# Patient Record
Sex: Male | Born: 1940 | Race: White | Hispanic: No | Marital: Married | Smoking: Former smoker
Health system: Southern US, Community
[De-identification: ages and names within clinical notes are randomized; demographics above are authoritative.]

## PROBLEM LIST (undated history)

## (undated) DIAGNOSIS — F419 Anxiety disorder, unspecified: Secondary | ICD-10-CM

## (undated) DIAGNOSIS — I2510 Atherosclerotic heart disease of native coronary artery without angina pectoris: Secondary | ICD-10-CM

## (undated) DIAGNOSIS — I5022 Chronic systolic (congestive) heart failure: Secondary | ICD-10-CM

## (undated) DIAGNOSIS — E785 Hyperlipidemia, unspecified: Secondary | ICD-10-CM

## (undated) DIAGNOSIS — M1990 Unspecified osteoarthritis, unspecified site: Secondary | ICD-10-CM

## (undated) DIAGNOSIS — I10 Essential (primary) hypertension: Secondary | ICD-10-CM

## (undated) DIAGNOSIS — S36503A Unspecified injury of sigmoid colon, initial encounter: Secondary | ICD-10-CM

## (undated) DIAGNOSIS — I493 Ventricular premature depolarization: Secondary | ICD-10-CM

## (undated) DIAGNOSIS — I214 Non-ST elevation (NSTEMI) myocardial infarction: Secondary | ICD-10-CM

## (undated) DIAGNOSIS — F32A Depression, unspecified: Secondary | ICD-10-CM

## (undated) DIAGNOSIS — I701 Atherosclerosis of renal artery: Secondary | ICD-10-CM

## (undated) DIAGNOSIS — I255 Ischemic cardiomyopathy: Secondary | ICD-10-CM

## (undated) DIAGNOSIS — I6529 Occlusion and stenosis of unspecified carotid artery: Secondary | ICD-10-CM

## (undated) DIAGNOSIS — I639 Cerebral infarction, unspecified: Secondary | ICD-10-CM

## (undated) DIAGNOSIS — C61 Malignant neoplasm of prostate: Secondary | ICD-10-CM

## (undated) DIAGNOSIS — F329 Major depressive disorder, single episode, unspecified: Secondary | ICD-10-CM

## (undated) HISTORY — DX: Atherosclerotic heart disease of native coronary artery without angina pectoris: I25.10

## (undated) HISTORY — DX: Essential (primary) hypertension: I10

## (undated) HISTORY — DX: Cerebral infarction, unspecified: I63.9

## (undated) HISTORY — DX: Unspecified osteoarthritis, unspecified site: M19.90

## (undated) HISTORY — DX: Depression, unspecified: F32.A

## (undated) HISTORY — DX: Ischemic cardiomyopathy: I25.5

## (undated) HISTORY — DX: Malignant neoplasm of prostate: C61

## (undated) HISTORY — PX: CORONARY ARTERY BYPASS GRAFT: SHX141

## (undated) HISTORY — DX: Non-ST elevation (NSTEMI) myocardial infarction: I21.4

## (undated) HISTORY — DX: Unspecified injury of sigmoid colon, initial encounter: S36.503A

## (undated) HISTORY — DX: Chronic systolic (congestive) heart failure: I50.22

## (undated) HISTORY — DX: Ventricular premature depolarization: I49.3

## (undated) HISTORY — PX: COLON SURGERY: SHX602

## (undated) HISTORY — DX: Atherosclerosis of renal artery: I70.1

## (undated) HISTORY — DX: Occlusion and stenosis of unspecified carotid artery: I65.29

## (undated) HISTORY — PX: PROSTATE SURGERY: SHX751

## (undated) HISTORY — DX: Major depressive disorder, single episode, unspecified: F32.9

## (undated) HISTORY — DX: Hyperlipidemia, unspecified: E78.5

## (undated) HISTORY — DX: Anxiety disorder, unspecified: F41.9

---

## 2007-04-06 ENCOUNTER — Ambulatory Visit: Payer: Self-pay | Admitting: Cardiology

## 2007-04-17 ENCOUNTER — Ambulatory Visit: Payer: Self-pay | Admitting: Cardiology

## 2007-07-03 ENCOUNTER — Ambulatory Visit: Payer: Self-pay | Admitting: Cardiology

## 2007-09-17 ENCOUNTER — Ambulatory Visit: Payer: Self-pay | Admitting: Cardiology

## 2007-09-25 ENCOUNTER — Ambulatory Visit: Payer: Self-pay

## 2007-09-26 ENCOUNTER — Ambulatory Visit: Payer: Self-pay | Admitting: Cardiology

## 2007-09-26 LAB — CONVERTED CEMR LAB
Basophils Absolute: 0 10*3/uL (ref 0.0–0.1)
Basophils Relative: 0.4 % (ref 0.0–1.0)
CO2: 30 meq/L (ref 19–32)
Chloride: 107 meq/L (ref 96–112)
Creatinine, Ser: 1.5 mg/dL (ref 0.4–1.5)
Lymphocytes Relative: 25 % (ref 12.0–46.0)
MCHC: 33 g/dL (ref 30.0–36.0)
Neutrophils Relative %: 67.3 % (ref 43.0–77.0)
Platelets: 222 10*3/uL (ref 150–400)
Potassium: 4.3 meq/L (ref 3.5–5.1)
Prothrombin Time: 13 s (ref 10.9–13.3)
RBC: 5.22 M/uL (ref 4.22–5.81)
WBC: 7.8 10*3/uL (ref 4.5–10.5)
aPTT: 28.6 s (ref 21.7–29.8)

## 2007-09-27 ENCOUNTER — Inpatient Hospital Stay (HOSPITAL_BASED_OUTPATIENT_CLINIC_OR_DEPARTMENT_OTHER): Admission: RE | Admit: 2007-09-27 | Discharge: 2007-09-27 | Payer: Self-pay | Admitting: Cardiology

## 2007-09-27 ENCOUNTER — Ambulatory Visit: Payer: Self-pay | Admitting: Cardiology

## 2007-10-09 ENCOUNTER — Ambulatory Visit: Payer: Self-pay | Admitting: Cardiology

## 2007-10-09 LAB — CONVERTED CEMR LAB
CO2: 27 meq/L (ref 19–32)
Chloride: 107 meq/L (ref 96–112)
Creatinine, Ser: 1.4 mg/dL (ref 0.4–1.5)
GFR calc non Af Amer: 54 mL/min
Potassium: 3.6 meq/L (ref 3.5–5.1)
Sodium: 143 meq/L (ref 135–145)

## 2007-12-31 ENCOUNTER — Ambulatory Visit: Payer: Self-pay | Admitting: Cardiology

## 2008-11-01 DIAGNOSIS — E785 Hyperlipidemia, unspecified: Secondary | ICD-10-CM

## 2008-11-01 DIAGNOSIS — I10 Essential (primary) hypertension: Secondary | ICD-10-CM | POA: Insufficient documentation

## 2008-11-14 ENCOUNTER — Encounter: Payer: Self-pay | Admitting: Cardiology

## 2009-01-05 ENCOUNTER — Encounter: Payer: Self-pay | Admitting: Cardiology

## 2009-01-21 ENCOUNTER — Ambulatory Visit: Payer: Self-pay | Admitting: Cardiology

## 2009-01-21 DIAGNOSIS — I6529 Occlusion and stenosis of unspecified carotid artery: Secondary | ICD-10-CM

## 2009-01-21 DIAGNOSIS — I701 Atherosclerosis of renal artery: Secondary | ICD-10-CM | POA: Insufficient documentation

## 2009-02-05 ENCOUNTER — Encounter: Payer: Self-pay | Admitting: Cardiology

## 2009-02-05 ENCOUNTER — Ambulatory Visit: Payer: Self-pay

## 2009-02-06 ENCOUNTER — Telehealth: Payer: Self-pay | Admitting: Cardiology

## 2009-02-11 ENCOUNTER — Ambulatory Visit: Payer: Self-pay | Admitting: Cardiology

## 2009-04-16 ENCOUNTER — Encounter: Payer: Self-pay | Admitting: Cardiology

## 2009-07-21 ENCOUNTER — Encounter: Payer: Self-pay | Admitting: Cardiology

## 2009-08-24 ENCOUNTER — Telehealth: Payer: Self-pay | Admitting: Cardiology

## 2009-08-25 ENCOUNTER — Encounter: Payer: Self-pay | Admitting: Cardiology

## 2009-10-08 DIAGNOSIS — I2510 Atherosclerotic heart disease of native coronary artery without angina pectoris: Secondary | ICD-10-CM | POA: Insufficient documentation

## 2009-10-19 ENCOUNTER — Ambulatory Visit: Payer: Self-pay | Admitting: Cardiology

## 2009-10-19 DIAGNOSIS — R002 Palpitations: Secondary | ICD-10-CM | POA: Insufficient documentation

## 2009-10-20 ENCOUNTER — Encounter: Payer: Self-pay | Admitting: Cardiology

## 2010-02-09 ENCOUNTER — Ambulatory Visit: Payer: Self-pay

## 2010-02-09 ENCOUNTER — Encounter: Payer: Self-pay | Admitting: Cardiology

## 2010-05-06 ENCOUNTER — Ambulatory Visit: Payer: Self-pay | Admitting: Cardiology

## 2010-05-06 ENCOUNTER — Encounter: Payer: Self-pay | Admitting: Cardiology

## 2010-05-08 ENCOUNTER — Ambulatory Visit: Payer: Self-pay | Admitting: Cardiovascular Disease

## 2010-05-08 ENCOUNTER — Ambulatory Visit: Payer: Self-pay | Admitting: Pulmonary Disease

## 2010-05-08 ENCOUNTER — Inpatient Hospital Stay (HOSPITAL_COMMUNITY)
Admission: EM | Admit: 2010-05-08 | Discharge: 2010-05-28 | Payer: Self-pay | Source: Home / Self Care | Attending: Thoracic Surgery (Cardiothoracic Vascular Surgery) | Admitting: Thoracic Surgery (Cardiothoracic Vascular Surgery)

## 2010-05-11 ENCOUNTER — Ambulatory Visit: Payer: Self-pay | Admitting: Thoracic Surgery (Cardiothoracic Vascular Surgery)

## 2010-05-11 ENCOUNTER — Encounter: Payer: Self-pay | Admitting: Thoracic Surgery (Cardiothoracic Vascular Surgery)

## 2010-05-11 ENCOUNTER — Encounter: Payer: Self-pay | Admitting: Cardiology

## 2010-05-17 ENCOUNTER — Encounter: Payer: Self-pay | Admitting: Thoracic Surgery (Cardiothoracic Vascular Surgery)

## 2010-06-22 ENCOUNTER — Encounter
Admission: RE | Admit: 2010-06-22 | Discharge: 2010-06-22 | Payer: Self-pay | Source: Home / Self Care | Attending: Thoracic Surgery (Cardiothoracic Vascular Surgery) | Admitting: Thoracic Surgery (Cardiothoracic Vascular Surgery)

## 2010-06-22 ENCOUNTER — Ambulatory Visit
Admission: RE | Admit: 2010-06-22 | Discharge: 2010-06-22 | Payer: Self-pay | Source: Home / Self Care | Attending: Thoracic Surgery (Cardiothoracic Vascular Surgery) | Admitting: Thoracic Surgery (Cardiothoracic Vascular Surgery)

## 2010-06-22 ENCOUNTER — Encounter: Payer: Self-pay | Admitting: Cardiology

## 2010-06-28 ENCOUNTER — Telehealth: Payer: Self-pay | Admitting: Cardiology

## 2010-06-29 ENCOUNTER — Encounter: Payer: Self-pay | Admitting: Cardiology

## 2010-06-29 ENCOUNTER — Ambulatory Visit
Admission: RE | Admit: 2010-06-29 | Discharge: 2010-06-29 | Payer: Self-pay | Source: Home / Self Care | Attending: Cardiology | Admitting: Cardiology

## 2010-06-29 DIAGNOSIS — I4891 Unspecified atrial fibrillation: Secondary | ICD-10-CM | POA: Insufficient documentation

## 2010-07-20 NOTE — Assessment & Plan Note (Signed)
Summary: PER CHECK OT/SF  Medications Added METOPROLOL TARTRATE 25 MG TABS (METOPROLOL TARTRATE) 1/2 TAB two times a day CLONIDINE HCL 0.1 MG TABS (CLONIDINE HCL) 1 tab two times a day TEKTURNA 150 MG TABS (ALISKIREN FUMARATE) 1 tab once daily MUCINEX 600 MG XR12H-TAB (GUAIFENESIN) 2 tab once daily        Visit Type:  6 mo f/u Primary Provider:  Merrily Pew  CC:  palpitations...chest tightness..pt has lost 20 lb since last year due to new dx of diabetes in Nov 2010.  History of Present Illness: Mr. Aaron [name] returns today for evaluation management of his coronary disease, occluded stent to the circumflex marginal with good collaterals, difficult to control hypertension, history of nonobstructive carotid disease, history of renal artery stenting.  He had a lot of palpitations lately. He says they're driving an absolute crazy. There is no rhyme or reason with him. They're clearly not associated with exertion. They're not worse at night.  We tried to increase his beta blocker but he had extreme fatigue as he puts it he fights depression. He clearly has reduction in his palpitations when he does take his beta blocker in the morning. He's currently on 25 mg metoprolol succinate a day.  He's having some exertional angina but this is stable. He refuses to take nitroglycerin because of previous intolerance.  His carotid Dopplers and renal ultrasound were stable in August of 2010.  I reviewed blood work from Dr. Tomasa Blase which demonstrates a total cholesterol 163, HDL 41, LDL of 95, and normal triglycerides. His blood sugar was 120 with a hemoglobin A1c of 7.4%.  Current Medications (verified): 1)  Amiloride Hcl 5 Mg Tabs (Amiloride Hcl) .Marland Kitchen.. 1 Tab Once Daily 2)  Metoprolol Succinate 25 Mg Xr24h-Tab (Metoprolol Succinate) .Marland Kitchen.. 1 Tab Once Daily 3)  Exforge 10-320 Mg Tabs (Amlodipine Besylate-Valsartan) .Marland Kitchen.. 1 Tab Once Daily 4)  Plavix 75 Mg Tabs (Clopidogrel Bisulfate) .Marland Kitchen.. 1 Tab Once Daily 5)   Aspirin 81 Mg Tbec (Aspirin) .... Take One Tablet By Mouth Daily 6)  Tricor 145 Mg Tabs (Fenofibrate) .Marland Kitchen.. 1 Tab Once Daily 7)  Clonidine Hcl 0.1 Mg Tabs (Clonidine Hcl) .Marland Kitchen.. 1 Tab Two Times A Day 8)  Mucinex 600 Mg Xr12h-Tab (Guaifenesin) .... 2 Tabs Once Daily 9)  Simvastatin 80 Mg Tabs (Simvastatin) .Marland Kitchen.. 1 Tab At Bedtime 10)  Tekturna 150 Mg Tabs (Aliskiren Fumarate) .Marland Kitchen.. 1 Tab Once Daily 11)  Prevacid 15 Mg Cpdr (Lansoprazole) .Marland Kitchen.. 1 Tab Once Daily 12)  Mucinex 600 Mg Xr12h-Tab (Guaifenesin) .... 2 Tab Once Daily  Allergies: 1)  ! Sulfa  Review of Systems       negative other than history of present illness  Vital Signs:  Patient profile:   70 year old male Height:      66 inches Weight:      164 pounds BMI:     26.57 Pulse rate:   64 / minute Pulse rhythm:   irregular BP sitting:   182 / 80  (left arm) Cuff size:   large  Vitals Entered By: Danielle Rankin, CMA (Oct 19, 2009 2:55 PM)  Physical Exam  General:  no acute distress, seems depressed, ruddy complexion which is baseline Head:  normocephalic and atraumatic Eyes:  PERRLA/EOM intact; conjunctiva and lids normal. Neck:  Neck supple, no JVD. No masses, thyromegaly or abnormal cervical nodes. Chest Laporcha Marchesi:  no deformities or breast masses noted Lungs:  Clear bilaterally to auscultation and percussion. Heart:  Non-displaced PMI, chest non-tender; regular  rate and rhythm, S1, S2 without murmurs, rubs or gallops. Carotid upstroke normal, no bruit. Normal abdominal aortic size, no bruits. Femorals normal pulses, no bruits. Pedals normal pulses. No edema, no varicosities. Abdomen:  Bowel sounds positive; abdomen soft and non-tender without masses, organomegaly, or hernias noted. No hepatosplenomegaly. Msk:  Back normal, normal gait. Muscle strength and tone normal. Pulses:  posterior tibials are 2+ over 4+, dorsalis pedis more difficult to palpate Extremities:  No clubbing or cyanosis. Neurologic:  Alert and oriented x  3. Skin:  Intact without lesions or rashes. Psych:  depressed affect.     Problems:  Medical Problems Added: 1)  Dx of Palpitations  (ICD-785.1)  EKG  Procedure date:  10/19/2009  Findings:      normal sinus rhythm, left axis deviation, left ventricular hypertrophy with ST segment changes in one and aVL.  Impression & Recommendations:  Problem # 1:  CAD, NATIVE VESSEL (ICD-414.01) Assessment Unchanged  He is having stable exertional angina. He does not like to take nitroglycerin. I suspect his coronary anatomy is stable. Continue medical therapy. His updated medication list for this problem includes:    Metoprolol Tartrate 25 Mg Tabs (Metoprolol tartrate) .Marland Kitchen... 1/2 tab two times a day    Exforge 10-320 Mg Tabs (Amlodipine besylate-valsartan) .Marland Kitchen... 1 tab once daily    Plavix 75 Mg Tabs (Clopidogrel bisulfate) .Marland Kitchen... 1 tab once daily    Aspirin 81 Mg Tbec (Aspirin) .Marland Kitchen... Take one tablet by mouth daily  Orders: EKG w/ Interpretation (93000)  Problem # 2:  HYPERTENSION, UNSPECIFIED (ICD-401.9) Assessment: Improved His blood pressures have been better running around 140 systolic since he cut back on his workload. His updated medication list for this problem includes:    Amiloride Hcl 5 Mg Tabs (Amiloride hcl) .Marland Kitchen... 1 tab once daily    Metoprolol Tartrate 25 Mg Tabs (Metoprolol tartrate) .Marland Kitchen... 1/2 tab two times a day    Exforge 10-320 Mg Tabs (Amlodipine besylate-valsartan) .Marland Kitchen... 1 tab once daily    Aspirin 81 Mg Tbec (Aspirin) .Marland Kitchen... Take one tablet by mouth daily    Clonidine Hcl 0.1 Mg Tabs (Clonidine hcl) .Marland Kitchen... 1 tab two times a day    Tekturna 150 Mg Tabs (Aliskiren fumarate) .Marland Kitchen... 1 tab once daily  His updated medication list for this problem includes:    Amiloride Hcl 5 Mg Tabs (Amiloride hcl) .Marland Kitchen... 1 tab once daily    Metoprolol Succinate 25 Mg Xr24h-tab (Metoprolol succinate) .Marland Kitchen... 1 tab once daily    Exforge 10-320 Mg Tabs (Amlodipine besylate-valsartan) .Marland Kitchen... 1 tab  once daily    Aspirin 81 Mg Tbec (Aspirin) .Marland Kitchen... Take one tablet by mouth daily    Clonidine Hcl 0.1 Mg Tabs (Clonidine hcl) .Marland Kitchen... 1 tab two times a day    Tekturna 150 Mg Tabs (Aliskiren fumarate) .Marland Kitchen... 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 1 tab once daily    Aspirin 81 Mg Tbec (Aspirin) .Marland Kitchen... Take one tablet by mouth daily  His updated medication list for this problem includes:    Plavix 75 Mg Tabs (Clopidogrel bisulfate) .Marland Kitchen... 1 tab once daily    Aspirin 81 Mg Tbec (Aspirin) .Marland Kitchen... Take one tablet by mouth daily  Problem # 4:  RENAL ARTERY STENOSIS (ICD-440.1) Assessment: Unchanged  Problem # 5:  PALPITATIONS (ICD-785.1) Assessment: Deteriorated He cannot tolerate a  higher dose of beta blocker. I have asked him to split his current metoprolol succinate 12-1/2 q.12 hours. If this does not work we'll try the short acting at the same dose. His palpitations clearly get better when he takes his beta blocker. His updated medication list for this problem includes:    Metoprolol Tartrate 25 Mg Tabs (Metoprolol tartrate) .Marland Kitchen... 1/2 tab two times a day    Exforge 10-320 Mg Tabs (Amlodipine besylate-valsartan) .Marland Kitchen... 1 tab once daily    Plavix 75 Mg Tabs (Clopidogrel bisulfate) .Marland Kitchen... 1 tab once daily    Aspirin 81 Mg Tbec (Aspirin) .Marland Kitchen... Take one tablet by mouth daily  His updated medication list for this problem includes:    Metoprolol Succinate 25 Mg Xr24h-tab (Metoprolol succinate) .Marland Kitchen... 1 tab once daily    Exforge 10-320 Mg Tabs (Amlodipine besylate-valsartan) .Marland Kitchen... 1 tab once daily    Plavix 75 Mg Tabs (Clopidogrel bisulfate) .Marland Kitchen... 1 tab once daily    Aspirin 81 Mg Tbec (Aspirin) .Marland Kitchen... Take one tablet by mouth daily  Patient Instructions: 1)  Your physician recommends that you schedule a follow-up appointment  in: 6 months with dr Tani Virgo 2)  Your physician has recommended you make the following change in your medication: decrease metoprolol succ to 12.5 mg two times a day when finished start metoprolol tart 12.5 mg two times a day  Prescriptions: METOPROLOL TARTRATE 25 MG TABS (METOPROLOL TARTRATE) 1/2 TAB two times a day  #30 x 11   Entered by:   Scherrie Bateman, LPN   Authorized by:   Gaylord Shih, MD, [facility]   Signed by:   Scherrie Bateman, LPN on 19/14/7829   Method used:   Electronically to        [facility].* (retail)       [address]       [place], [place]  [address]       Ph: [phone]       Fax: [phone]   RxID:   [id number]

## 2010-07-20 NOTE — Assessment & Plan Note (Signed)
Summary: 6 mo f/u ./cy  Medications Added LIPITOR 40 MG TABS (ATORVASTATIN CALCIUM) Take 1 tablet at bedtime LANSOPRAZOLE 30 MG CPDR (LANSOPRAZOLE) Take 1 tablet daily NITROSTAT 0.4 MG SUBL (NITROGLYCERIN) 1 tablet under tongue at onset of chest pain; you may repeat every 5 minutes for up to 3 doses.        Visit Type:  6 mo f/u Primary Aaron [name]:  Aaron [name]  CC:  LUQ discomfort states he thinks is coming from eating and stress....sob....edema/hands/feet....pt c/o right leg cramp last night...pt has gained 17 lb's since May/2011.  History of Present Illness: Mr. Litt returns today for evaluation and management of his multiple cardiac and vascular issues as well as severe and difficult to control hypertension.  He has been having substernal chest discomfort radiating to his jaws both after eating and sometimes with exertion. He has been reluctant to take any nitroglycerin because he said he took it once and it may go out. Specifics not clear  He denies orthopnea, PND or edema. Recent blood work from his primary care showed stable renal function with a creatinine 1.32. Lipids are at goal but on 80 mg of simvastatin. I reviewed these numbers with him today.  His blood pressure has been under better control. He is on extended leave at the present time.  Carotid Dopplers were stable in August. Repeat in one year. He's had no symptoms of TIAs or mini strokes.  His EKG today shows some increased T wave changes laterally. Blood pressure is pretty good today.  Clinical Reports Reviewed:  Cardiac Cath:  09/27/2007: Cardiac Cath Findings:   CONCLUSION:   1. Abnormal Myoview in the lateral territory with occlusion of       previously placed drug-eluting stent.   2. Continued patency of the RCA stent.   3. Scattered coronary artery disease as described above.      DISPOSITION:  Procedure was accomplished with less than 50 mL of   contrast.  IV fluids will be administered.  The patient  is to see Dr.   Daleen Squibb back in followup within the next week.  He will be encouraged to   have fluids and will get a basic metabolic profile at that time.        Aaron [name]. Aaron Kill, MD, [facility]   Nuclear Study:  09/25/2007:  Exercise capacity - Adenosine with low level exercise  Blood Pressure - Normal BP response  Clinical Symptoms - No chest pain or dyspnea  ECG impression - Baseline ST/T wave changes  Overall impression -  Small basal alteral infarct with moderate perinfarct ischemia involving entire lateral to anterolateral wall from apex to base. Dr Daleen Squibb aware.  09/11/1996:  Persantine Technetium 106M Cardiolite myocardial perfusion study revealing normal static and dynamic myocardial perfusion images with an EF of 58% by quantitated gated spect. Based on this study, a follow-up resting study will not be obtained  Carotid Doppler:  02/09/2010:  Impressions: Essentially stable, mild carotid artery disease, bilaterally. 40-59% bilateral ICA stenosis.  Aaron Bollman, MD  02/05/2009:  Impressions: Stable, mild, carotid disease, bilaterally. 0-39% RICA stenosis 40-59% LICA stenosis.  Aaron Haws, MD  09/25/2007:  Mild carotid disease, bialterally, left greater than right. 0 - 39% RICA stenosis 40 - 59% LICA stenosis   Current Medications (verified): 1)  Amiloride Hcl 5 Mg Tabs (Amiloride Hcl) .Marland Kitchen.. 1 Tab Once Daily 2)  Metoprolol Tartrate 25 Mg Tabs (Metoprolol Tartrate) .... 1/2 Tab Two Times A Day 3)  Exforge 10-320 Mg Tabs (  Amlodipine Besylate-Valsartan) .Marland Kitchen.. 1 Tab Once Daily 4)  Plavix 75 Mg Tabs (Clopidogrel Bisulfate) .Marland Kitchen.. 1 Tab Once Daily 5)  Aspirin 81 Mg Tbec (Aspirin) .... Take One Tablet By Mouth Daily 6)  Tricor 145 Mg Tabs (Fenofibrate) .Marland Kitchen.. 1 Tab Once Daily 7)  Clonidine Hcl 0.1 Mg Tabs (Clonidine Hcl) .Marland Kitchen.. 1 Tab Two Times A Day 8)  Mucinex 600 Mg Xr12h-Tab (Guaifenesin) .... 2 Tabs Once Daily 9)  Simvastatin 80 Mg Tabs (Simvastatin) .Marland Kitchen.. 1 Tab At Bedtime 10)   Tekturna 150 Mg Tabs (Aliskiren Fumarate) .Marland Kitchen.. 1 Tab Once Daily 11)  Prevacid 15 Mg Cpdr (Lansoprazole) .Marland Kitchen.. 1 Tab Once Daily 12)  Mucinex 600 Mg Xr12h-Tab (Guaifenesin) .... 2 Tab Once Daily  Allergies: 1)  ! Sulfa 2)  ! 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 1970's Alcohol Use - no Regular Exercise - yes Drug Use - no  Risk Factors: Exercise: yes (11/01/2008)  Risk Factors: Smoking Status: quit (11/01/2008)  Review of Systems       negative other than history of present illness  Vital Signs:  Patient profile:   70 year old male Height:      66 inches Weight:      181.4 pounds Pulse rate:   61 / minute Pulse rhythm:   irregular BP sitting:   144 / 80  (left arm) Cuff size:   large  Vitals Entered By: Danielle Rankin, CMA (May 06, 2010 9:51 AM)  Physical Exam  General:  no acute distress, overweight Head:  normocephalic and atraumatic Eyes:  PERRLA/EOM intact; conjunctiva and lids normal. Neck:  Neck supple, no JVD. No masses, thyromegaly or abnormal cervical nodes. Lungs:  Clear bilaterally to auscultation and percussion. Heart:  PMI nondisplaced, regular rate and rhythm, no significant murmur even at the apex, no gallop, carotid upstrokes equal bilaterally without change in bruits Abdomen:  no midline or flank bruit Msk:  Back normal, normal gait. Muscle strength and tone normal. Pulses:  pulses normal in all 4 extremities Extremities:  No clubbing or cyanosis. Neurologic:  Alert and  oriented x 3. Skin:  Intact without lesions or rashes. Psych:  Normal affect.   Impression & Recommendations:  Problem # 1:  CAD, NATIVE VESSEL (ICD-414.01) Assessment Deteriorated I am concerned he is having more angina. I have reinforced the fact he needs to take nitroglycerin when it lasts more than 5-10 minutes. If it is not relieved after 2 nitroglycerin he's been instructed to call 911. He will use it while lying down so hopefully not get presyncopal or syncopal. He continues to have frequent angina, will perform a stress Myoview to see if there is more high-risk anatomy then in 2009. With his renal insufficiency will keep cardiac catheterization as a last alternative. I will see him back in 3 months. His updated medication list for this problem includes:    Metoprolol Tartrate 25 Mg Tabs (Metoprolol tartrate) .Marland Kitchen... 1/2 tab two times a day    Exforge 10-320 Mg Tabs (Amlodipine besylate-valsartan) .Marland Kitchen... 1 tab once daily    Plavix 75 Mg Tabs (Clopidogrel bisulfate) .Marland Kitchen... 1 tab once daily    Aspirin  81 Mg Tbec (Aspirin) .Marland Kitchen... Take one tablet by mouth daily    Nitrostat 0.4 Mg Subl (Nitroglycerin) .Marland Kitchen... 1 tablet under tongue at onset of chest pain; you may repeat every 5 minutes for up to 3 doses.  Orders: EKG w/ Interpretation (93000)  His updated medication list for this problem includes:    Metoprolol Tartrate 25 Mg Tabs (Metoprolol tartrate) .Marland Kitchen... 1/2 tab two times a day    Exforge 10-320 Mg Tabs (Amlodipine besylate-valsartan) .Marland Kitchen... 1 tab once daily    Plavix 75 Mg Tabs (Clopidogrel bisulfate) .Marland Kitchen... 1 tab once daily    Aspirin 81 Mg Tbec (Aspirin) .Marland Kitchen... Take one tablet by mouth daily    Nitrostat 0.4 Mg Subl (Nitroglycerin) .Marland Kitchen... 1 tablet under tongue at onset of chest pain; you may repeat every 5 minutes for up to 3 doses.  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 1 tab once daily    Aspirin 81 Mg Tbec (Aspirin) .Marland Kitchen... Take one tablet by mouth daily  His updated medication list for this problem includes:    Plavix 75 Mg Tabs (Clopidogrel bisulfate) .Marland Kitchen... 1 tab once daily    Aspirin 81 Mg Tbec (Aspirin) .Marland Kitchen... Take one tablet by mouth daily  Problem # 4:  HYPERTENSION, UNSPECIFIED (ICD-401.9) Assessment: Improved  His updated medication list for this problem includes:    Amiloride Hcl 5 Mg Tabs (Amiloride hcl) .Marland Kitchen... 1 tab once daily    Metoprolol Tartrate 25 Mg Tabs (Metoprolol tartrate) .Marland Kitchen... 1/2 tab two times a day    Exforge 10-320 Mg Tabs (Amlodipine besylate-valsartan) .Marland Kitchen... 1 tab once daily    Aspirin 81 Mg Tbec (Aspirin) .Marland Kitchen... Take one tablet by mouth daily    Clonidine Hcl 0.1 Mg Tabs (Clonidine hcl) .Marland Kitchen... 1 tab two times a day    Tekturna 150 Mg Tabs (Aliskiren fumarate) .Marland Kitchen... 1 tab once daily  His updated medication list for this problem includes:    Amiloride Hcl 5 Mg Tabs (Amiloride hcl) .Marland Kitchen... 1 tab once daily    Metoprolol Tartrate 25 Mg Tabs (Metoprolol tartrate) .Marland Kitchen... 1/2 tab two times a day    Exforge 10-320 Mg Tabs (Amlodipine besylate-valsartan) .Marland Kitchen... 1 tab once daily    Aspirin 81 Mg Tbec (Aspirin) .Marland Kitchen... Take one tablet by mouth daily    Clonidine Hcl 0.1 Mg Tabs (Clonidine hcl) .Marland Kitchen... 1 tab two times a day    Tekturna 150 Mg Tabs (Aliskiren fumarate) .Marland Kitchen... 1 tab once daily  Problem # 5:  HYPERLIPIDEMIA-MIXED (ICD-272.4) we'll change his simvastatin to 40 mg of Lipitor. Followup blood work with his primary care in 6 weeks. His updated medication list for this problem includes:    Tricor 145 Mg Tabs (Fenofibrate) .Marland Kitchen... 1 tab once daily    Lipitor 40 Mg Tabs (Atorvastatin calcium) .Marland Kitchen... Take 1 tablet at bedtime  His updated medication list for this problem includes:    Tricor 145 Mg Tabs (Fenofibrate) .Marland Kitchen... 1 tab  once daily    Lipitor 40 Mg Tabs (Atorvastatin calcium) .Marland Kitchen... Take 1 tablet at bedtime  Patient Instructions: 1)  Your physician recommends that you schedule a follow-up appointment in: 3 months with Dr. Daleen Squibb 2)  Your physician has recommended you make the following change in your medication:  3)  Your physician recommends that you have  a FASTING lipid profile & cmp with your primary care doctor in 6 weeks. Prescriptions: LANSOPRAZOLE 30 MG CPDR (LANSOPRAZOLE) Take 1 tablet daily  #30 x 11   Entered by:   Lisabeth Devoid RN   Authorized by:   Gaylord Shih, MD, [facility]   Signed by:   Lisabeth Devoid RN on 05/06/2010   Method used:   Electronically to        [facility].* (retail)       [address]       [place], [place]  [address]       Ph: [phone]       Fax: [phone]   RxID:   [id number] LIPITOR 40 MG TABS (ATORVASTATIN CALCIUM) Take 1 tablet at bedtime  #30 x 11   Entered by:   Lisabeth Devoid RN   Authorized by:   Gaylord Shih, MD, [facility]   Signed by:   Lisabeth Devoid RN on 05/06/2010   Method used:   Electronically to        [facility].* (retail)       [address]       [place], [place]  [address]       Ph: [phone]       Fax: [phone]   RxID:   [id number] NITROSTAT 0.4 MG SUBL (NITROGLYCERIN) 1 tablet under tongue at onset of chest pain; you may repeat every 5 minutes for up to 3 doses.  #25 x 6   Entered by:   Lisabeth Devoid RN   Authorized by:   Gaylord Shih, MD, [facility]   Signed by:   Lisabeth Devoid RN on 05/06/2010   Method used:   Electronically to        [facility] (retail)       [address]       [place], [place]  [address]       Ph: [phone]       Fax: [phone]   RxID:   [id number]

## 2010-07-20 NOTE — Miscellaneous (Signed)
Summary: Orders Update  Clinical Lists Changes  Orders: Added new Test order of Carotid Duplex (Carotid Duplex) - Signed 

## 2010-07-20 NOTE — Miscellaneous (Signed)
  Clinical Lists Changes  Medications: Changed medication from TOPROL XL 25 MG XR24H-TAB (METOPROLOL SUCCINATE) 1 tab once daily to TOPROL XL 50 MG XR24H-TAB (METOPROLOL SUCCINATE) 1 once daily

## 2010-07-20 NOTE — Progress Notes (Signed)
Summary: PT HAVING HEART PALP   Phone Note Call from Patient Call back at [phone]   Caller: Patient Summary of Call: PT HAVING HEART PALP. Initial call taken by: Judie Grieve,  August 24, 2009 8:10 AM  Follow-up for Phone Call        for past few weeks pt has been having palps, he feels that his heart is skipping occ., same as prev. palps just has noticed more freqently lately, is under a lot of stress, hasn't had increase in caff. intake, no chest pain, he has oc. taken extra 1/2 of toprol w/some relief, BP runs 150-190s/70-80s HR usually in the 60s, will send to Dr Daleen Squibb for review 3/8 Meredith Staggers, RN  August 24, 2009 8:42 AM   Additional Follow-up for Phone Call Additional follow up Details #1::        iNCREASE TOPROL TO 50MG /DAY Additional Follow-up by: Gaylord Shih, MD, [facility],  August 24, 2009 1:50 PM     Appended Document: PT HAVING HEART PALP PT AWARE./CY

## 2010-07-22 NOTE — Progress Notes (Signed)
Summary: pt having b/p problems   Phone Note From Other Clinic Call back at [phone]   Caller: Marylene Land from [facility]  Request: Talk with Nurse, Talk with Provider Summary of Call: pt b/p was elevated friday 1/6 around 11am it was 168/76 in the right arm and she took it again about later in the left arm 152/72. pt was seen again today and in left arm it was 160/76 pt has no other symptoms to go with it. per pt he has a clonadine patch and he took it off on saturday and today he took a clonadine pill 0.1mg  and he took one last night too.     06/28/10--5pm--unable to get angela from [facility] on phone--phoned pt who states he feels fine and no symptoms noted-states is used to taking clonidine for hypertension--states patches are too expensive so is now taking clonidine tablet 0.1mg  --advised to continue using clonidine and if BP elevated please call us--pt agrees--nt  Appended Document: pt having b/p problems continue clonidine at .1mg  bid

## 2010-07-22 NOTE — Assessment & Plan Note (Signed)
Summary: eph/d/c cone 05/21/10/mj  Medications Added METOPROLOL TARTRATE 25 MG TABS (METOPROLOL TARTRATE) 1 TAB two times a day MUCINEX 600 MG XR12H-TAB (GUAIFENESIN) 2 tab once daily ASPIRIN 81 MG TBEC (ASPIRIN) Take one tablet by mouth daily PLAVIX 75 MG TABS (CLOPIDOGREL BISULFATE) Take one tablet by mouth daily      Allergies Added:   Visit Type:  EPH Primary Provider:  Merrily Pew  CC:  sob w/walking....chest soreness.  History of Present Illness: Aaron [name] returns today after being discharged the hospital with a non-ST segment elevation MI, subsequent coronary bypass grafting with a left internal mammary artery to LAD, vein graft to first obtuse marginal, vein graft to posterior descending. His preoperative ejection fraction was 40% with inferior and apical Kaprice Kage motion abnormalities. Carotid Dopplers were stable.  He had some postoperative atrial fibrillation. He went on on amiodarone but he developed some shortness of breath and cough and was discontinued by Dr. Dorris Fetch.  He also had a rare postoperative event where he ruptured his sigmoid colon spontaneously. He required a sigmoid colectomy and end colostomy by Dr. Janee Morn. He is a followup visit with him tomorrow.  He's having verbal chest Jacqualynn Parco soreness.  I note also he went home off of his Plavix and aspirin. He was on this prior to his infarct and bypass surgery. He reports no bleeding or melena.   Current Medications (verified): 1)  Amiloride Hcl 5 Mg Tabs (Amiloride Hcl) .Marland Kitchen.. 1 Tab Once Daily 2)  Metoprolol Tartrate 25 Mg Tabs (Metoprolol Tartrate) .Marland Kitchen.. 1 Tab Two Times A Day 3)  Exforge 10-320 Mg Tabs (Amlodipine Besylate-Valsartan) .Marland Kitchen.. 1 Tab Once Daily 4)  Tricor 145 Mg Tabs (Fenofibrate) .Marland Kitchen.. 1 Tab Once Daily 5)  Clonidine Hcl 0.1 Mg Tabs (Clonidine Hcl) .Marland Kitchen.. 1 Tab Two Times A Day 6)  Mucinex 600 Mg Xr12h-Tab (Guaifenesin) .... 2 Tabs Once Daily 7)  Lipitor 40 Mg Tabs (Atorvastatin Calcium) .... Take 1 Tablet  At Bedtime 8)  Tekturna 150 Mg Tabs (Aliskiren Fumarate) .Marland Kitchen.. 1 Tab Once Daily 9)  Lansoprazole 30 Mg Cpdr (Lansoprazole) .... Take 1 Tablet Daily 10)  Mucinex 600 Mg Xr12h-Tab (Guaifenesin) .... 2 Tab Once Daily 11)  Nitrostat 0.4 Mg Subl (Nitroglycerin) .Marland Kitchen.. 1 Tablet Under Tongue At Onset of Chest Pain; You May Repeat Every 5 Minutes For Up To 3 Doses.  Allergies (verified): 1)  ! Sulfa 2)  ! * Nitro  Past History:  Past Medical History: Last updated: 10/08/2009 CAD, NATIVE VESSEL (ICD-414.01) RENAL ARTERY STENOSIS (ICD-440.1) CAROTID ARTERY STENOSIS, WITHOUT INFARCTION (ICD-433.10) HYPERTENSION, UNSPECIFIED (ICD-401.9) HYPERLIPIDEMIA-MIXED (ICD-272.4)    Past Surgical History: Last updated: 06/18/2010 Total prostatectomy CABG X 3.....05/12/10 Sigmoid colectomy End colostomy Repair of umbilical hernia  Family History: Last updated: 11/01/2008 Family History of Coronary Artery Disease:   Social History: Last updated: 11/01/2008 Full Time Married  Tobacco Use - Former. 1970's Alcohol Use - no Regular Exercise - yes Drug Use - no  Risk Factors: Exercise: yes (11/01/2008)  Risk Factors: Smoking Status: quit (11/01/2008)  Review of Systems       negative other than history of present illness  Vital Signs:  Patient profile:   70 year old male Height:      66 inches Weight:      170 pounds BMI:     27.54 Pulse rate:   63 / minute Pulse rhythm:   irregular BP sitting:   178 / 78  (left arm) Cuff size:   large  Vitals Entered By:  Danielle Rankin, CMA (June 29, 2010 2:14 PM)  Physical Exam  General:  obese.  no acute distress Head:  normocephalic and atraumatic Eyes:  PERRLA/EOM intact; conjunctiva and lids normal. Neck:  Neck supple, no JVD. No masses, thyromegaly or abnormal cervical nodes. Chest Aaron [name]:  no deformities or breast masses noted Lungs:  Clear bilaterally to auscultation and percussion. Heart:  PMI nondisplaced, regular rate and rhythm,  normal S1-S2, left carotid bruit Abdomen:  good bowel sounds, colostomy in place. Msk:  Back normal, normal gait. Muscle strength and tone normal. Pulses:  pulses normal in all 4 extremities Extremities:  No clubbing or cyanosis. Neurologic:  Alert and oriented x 3. Skin:  Intact without lesions or rashes. Psych:  Normal affect.   Problems:  Medical Problems Added: 1)  Dx of Atrial Fibrillation  (ICD-427.31)  Impression & Recommendations:  Problem # 1:  CAD, NATIVE VESSEL (ICD-414.01) Assessment Unchanged Will restart 81 mg of aspirin and Plavix 75 mg a day. He will discuss with Dr. Janee Morn tomorrow. The following medications were removed from the medication list:    Plavix 75 Mg Tabs (Clopidogrel bisulfate) .Marland Kitchen... 1 tab once daily    Aspirin 81 Mg Tbec (Aspirin) .Marland Kitchen... Take one tablet by mouth daily His updated medication list for this problem includes:    Metoprolol Tartrate 25 Mg Tabs (Metoprolol tartrate) .Marland Kitchen... 1 tab two times a day    Exforge 10-320 Mg Tabs (Amlodipine besylate-valsartan) .Marland Kitchen... 1 tab once daily    Nitrostat 0.4 Mg Subl (Nitroglycerin) .Marland Kitchen... 1 tablet under tongue at onset of chest pain; you may repeat every 5 minutes for up to 3 doses.    Aspirin 81 Mg Tbec (Aspirin) .Marland Kitchen... Take one tablet by mouth daily    Plavix 75 Mg Tabs (Clopidogrel bisulfate) .Marland Kitchen... Take one tablet by mouth daily  Problem # 2:  CAROTID ARTERY STENOSIS, WITHOUT INFARCTION (ICD-433.10) Assessment: Unchanged  The following medications were removed from the medication list:    Plavix 75 Mg Tabs (Clopidogrel bisulfate) .Marland Kitchen... 1 tab once daily    Aspirin 81 Mg Tbec (Aspirin) .Marland Kitchen... Take one tablet by mouth daily His updated medication list for this problem includes:    Aspirin 81 Mg Tbec (Aspirin) .Marland Kitchen... Take one tablet by mouth daily    Plavix 75 Mg Tabs (Clopidogrel bisulfate) .Marland Kitchen... Take one tablet by mouth daily  Problem # 3:  RENAL ARTERY STENOSIS (ICD-440.1)  Problem # 4:  HYPERTENSION,  UNSPECIFIED (ICD-401.9) Assessment: Unchanged  The following medications were removed from the medication list:    Aspirin 81 Mg Tbec (Aspirin) .Marland Kitchen... Take one tablet by mouth daily His updated medication list for this problem includes:    Amiloride Hcl 5 Mg Tabs (Amiloride hcl) .Marland Kitchen... 1 tab once daily    Metoprolol Tartrate 25 Mg Tabs (Metoprolol tartrate) .Marland Kitchen... 1 tab two times a day    Exforge 10-320 Mg Tabs (Amlodipine besylate-valsartan) .Marland Kitchen... 1 tab once daily    Clonidine Hcl 0.1 Mg Tabs (Clonidine hcl) .Marland Kitchen... 1 tab two times a day    Tekturna 150 Mg Tabs (Aliskiren fumarate) .Marland Kitchen... 1 tab once daily    Aspirin 81 Mg Tbec (Aspirin) .Marland Kitchen... Take one tablet by mouth daily  Problem # 5:  ATRIAL FIBRILLATION (ICD-427.31) Assessment: Improved  The following medications were removed from the medication list:    Plavix 75 Mg Tabs (Clopidogrel bisulfate) .Marland Kitchen... 1 tab once daily    Aspirin 81 Mg Tbec (Aspirin) .Marland Kitchen... Take one tablet by mouth daily His updated  medication list for this problem includes:    Metoprolol Tartrate 25 Mg Tabs (Metoprolol tartrate) .Marland Kitchen... 1 tab two times a day    Aspirin 81 Mg Tbec (Aspirin) .Marland Kitchen... Take one tablet by mouth daily    Plavix 75 Mg Tabs (Clopidogrel bisulfate) .Marland Kitchen... Take one tablet by mouth daily  Patient Instructions: 1)  Your physician recommends that you schedule a follow-up appointment in: 2 months with Dr. Daleen Squibb 2)  Your physician has recommended you make the following change in your medication: Restart Aspirin & Plavix Prescriptions: PLAVIX 75 MG TABS (CLOPIDOGREL BISULFATE) Take one tablet by mouth daily  #30 x 11   Entered by:   Lisabeth Devoid RN   Authorized by:   Gaylord Shih, MD, [facility]   Signed by:   Gaylord Shih, MD, [facility] on 06/29/2010   Method used:   Historical   RxID:   [id number] METOPROLOL TARTRATE 25 MG TABS (METOPROLOL TARTRATE) 1 TAB two times a day  #60 x 11   Entered by:   Danielle Rankin, CMA   Authorized by:   Gaylord Shih, MD, [facility]    Signed by:   Danielle Rankin, CMA on 06/29/2010   Method used:   Electronically to        [facility] (retail)       [address]       [place], [place]  [address]       Ph: [phone]       Fax: [phone]   RxID:   [id number]

## 2010-07-22 NOTE — Consult Note (Signed)
Summary: [facility] Consultation Report  [facility] Consultation Report   Imported By: Earl Many 06/29/2010 16:35:14  _____________________________________________________________________  External Attachment:    Type:   Image     Comment:   External Document

## 2010-08-05 NOTE — Progress Notes (Signed)
Summary: [facility]: Office Visit  [facility]: Office Visit   Imported By: Earl Many 07/27/2010 16:00:59  _____________________________________________________________________  External Attachment:    Type:   Image     Comment:   External Document

## 2010-08-23 ENCOUNTER — Ambulatory Visit (INDEPENDENT_AMBULATORY_CARE_PROVIDER_SITE_OTHER): Payer: Medicare Other | Admitting: Cardiology

## 2010-08-23 ENCOUNTER — Encounter: Payer: Self-pay | Admitting: Cardiology

## 2010-08-23 DIAGNOSIS — R609 Edema, unspecified: Secondary | ICD-10-CM | POA: Insufficient documentation

## 2010-08-23 DIAGNOSIS — I10 Essential (primary) hypertension: Secondary | ICD-10-CM

## 2010-08-23 DIAGNOSIS — I252 Old myocardial infarction: Secondary | ICD-10-CM

## 2010-08-23 DIAGNOSIS — I2510 Atherosclerotic heart disease of native coronary artery without angina pectoris: Secondary | ICD-10-CM

## 2010-08-31 LAB — CARDIAC PANEL(CRET KIN+CKTOT+MB+TROPI)
CK, MB: 5.9 ng/mL — ABNORMAL HIGH (ref 0.3–4.0)
Relative Index: 3.3 — ABNORMAL HIGH (ref 0.0–2.5)
Relative Index: 5 — ABNORMAL HIGH (ref 0.0–2.5)
Total CK: 181 U/L (ref 7–232)
Total CK: 294 U/L — ABNORMAL HIGH (ref 7–232)
Troponin I: 2.57 ng/mL (ref 0.00–0.06)
Troponin I: 4.1 ng/mL (ref 0.00–0.06)
Troponin I: 4.71 ng/mL (ref 0.00–0.06)

## 2010-08-31 LAB — BASIC METABOLIC PANEL
BUN: 21 mg/dL (ref 6–23)
BUN: 21 mg/dL (ref 6–23)
BUN: 27 mg/dL — ABNORMAL HIGH (ref 6–23)
BUN: 29 mg/dL — ABNORMAL HIGH (ref 6–23)
BUN: 30 mg/dL — ABNORMAL HIGH (ref 6–23)
BUN: 34 mg/dL — ABNORMAL HIGH (ref 6–23)
BUN: 38 mg/dL — ABNORMAL HIGH (ref 6–23)
BUN: 38 mg/dL — ABNORMAL HIGH (ref 6–23)
BUN: 42 mg/dL — ABNORMAL HIGH (ref 6–23)
BUN: 42 mg/dL — ABNORMAL HIGH (ref 6–23)
BUN: 45 mg/dL — ABNORMAL HIGH (ref 6–23)
CO2: 25 mEq/L (ref 19–32)
CO2: 25 mEq/L (ref 19–32)
CO2: 26 mEq/L (ref 19–32)
CO2: 26 mEq/L (ref 19–32)
CO2: 27 mEq/L (ref 19–32)
CO2: 27 mEq/L (ref 19–32)
CO2: 28 mEq/L (ref 19–32)
CO2: 29 mEq/L (ref 19–32)
CO2: 29 mEq/L (ref 19–32)
CO2: 29 mEq/L (ref 19–32)
CO2: 30 mEq/L (ref 19–32)
CO2: 31 mEq/L (ref 19–32)
CO2: 31 mEq/L (ref 19–32)
Calcium: 7.2 mg/dL — ABNORMAL LOW (ref 8.4–10.5)
Calcium: 7.5 mg/dL — ABNORMAL LOW (ref 8.4–10.5)
Calcium: 7.5 mg/dL — ABNORMAL LOW (ref 8.4–10.5)
Calcium: 7.7 mg/dL — ABNORMAL LOW (ref 8.4–10.5)
Calcium: 7.7 mg/dL — ABNORMAL LOW (ref 8.4–10.5)
Calcium: 7.7 mg/dL — ABNORMAL LOW (ref 8.4–10.5)
Calcium: 7.8 mg/dL — ABNORMAL LOW (ref 8.4–10.5)
Calcium: 7.9 mg/dL — ABNORMAL LOW (ref 8.4–10.5)
Calcium: 8 mg/dL — ABNORMAL LOW (ref 8.4–10.5)
Calcium: 8 mg/dL — ABNORMAL LOW (ref 8.4–10.5)
Calcium: 8.1 mg/dL — ABNORMAL LOW (ref 8.4–10.5)
Calcium: 8.2 mg/dL — ABNORMAL LOW (ref 8.4–10.5)
Calcium: 8.5 mg/dL (ref 8.4–10.5)
Calcium: 8.7 mg/dL (ref 8.4–10.5)
Calcium: 9.1 mg/dL (ref 8.4–10.5)
Chloride: 104 mEq/L (ref 96–112)
Chloride: 105 mEq/L (ref 96–112)
Chloride: 105 mEq/L (ref 96–112)
Chloride: 105 mEq/L (ref 96–112)
Chloride: 106 mEq/L (ref 96–112)
Chloride: 106 mEq/L (ref 96–112)
Chloride: 107 mEq/L (ref 96–112)
Chloride: 108 mEq/L (ref 96–112)
Chloride: 109 mEq/L (ref 96–112)
Chloride: 110 mEq/L (ref 96–112)
Chloride: 110 mEq/L (ref 96–112)
Chloride: 113 mEq/L — ABNORMAL HIGH (ref 96–112)
Creatinine, Ser: 1.05 mg/dL (ref 0.4–1.5)
Creatinine, Ser: 1.16 mg/dL (ref 0.4–1.5)
Creatinine, Ser: 1.17 mg/dL (ref 0.4–1.5)
Creatinine, Ser: 1.25 mg/dL (ref 0.4–1.5)
Creatinine, Ser: 1.31 mg/dL (ref 0.4–1.5)
Creatinine, Ser: 1.43 mg/dL (ref 0.4–1.5)
Creatinine, Ser: 1.47 mg/dL (ref 0.4–1.5)
Creatinine, Ser: 1.49 mg/dL (ref 0.4–1.5)
Creatinine, Ser: 1.53 mg/dL — ABNORMAL HIGH (ref 0.4–1.5)
Creatinine, Ser: 1.67 mg/dL — ABNORMAL HIGH (ref 0.4–1.5)
Creatinine, Ser: 1.7 mg/dL — ABNORMAL HIGH (ref 0.4–1.5)
Creatinine, Ser: 1.87 mg/dL — ABNORMAL HIGH (ref 0.4–1.5)
Creatinine, Ser: 2.33 mg/dL — ABNORMAL HIGH (ref 0.4–1.5)
GFR calc Af Amer: 34 mL/min — ABNORMAL LOW (ref >60)
GFR calc Af Amer: 44 mL/min — ABNORMAL LOW (ref >60)
GFR calc Af Amer: 48 mL/min — ABNORMAL LOW (ref >60)
GFR calc Af Amer: 49 mL/min — ABNORMAL LOW (ref >60)
GFR calc Af Amer: 50 mL/min — ABNORMAL LOW (ref >60)
GFR calc Af Amer: 55 mL/min — ABNORMAL LOW (ref >60)
GFR calc Af Amer: 57 mL/min — ABNORMAL LOW (ref >60)
GFR calc Af Amer: 58 mL/min — ABNORMAL LOW (ref >60)
GFR calc Af Amer: 60 mL/min — ABNORMAL LOW (ref >60)
GFR calc Af Amer: >60 mL/min (ref >60)
GFR calc Af Amer: >60 mL/min (ref >60)
GFR calc Af Amer: >60 mL/min (ref >60)
GFR calc Af Amer: >60 mL/min (ref >60)
GFR calc Af Amer: >60 mL/min (ref >60)
GFR calc non Af Amer: 28 mL/min — ABNORMAL LOW (ref >60)
GFR calc non Af Amer: 36 mL/min — ABNORMAL LOW (ref >60)
GFR calc non Af Amer: 41 mL/min — ABNORMAL LOW (ref >60)
GFR calc non Af Amer: 41 mL/min — ABNORMAL LOW (ref >60)
GFR calc non Af Amer: 43 mL/min — ABNORMAL LOW (ref >60)
GFR calc non Af Amer: 45 mL/min — ABNORMAL LOW (ref >60)
GFR calc non Af Amer: 47 mL/min — ABNORMAL LOW (ref >60)
GFR calc non Af Amer: 47 mL/min — ABNORMAL LOW (ref >60)
GFR calc non Af Amer: 48 mL/min — ABNORMAL LOW (ref >60)
GFR calc non Af Amer: 54 mL/min — ABNORMAL LOW (ref >60)
GFR calc non Af Amer: >60 mL/min (ref >60)
GFR calc non Af Amer: >60 mL/min (ref >60)
GFR calc non Af Amer: >60 mL/min (ref >60)
GFR calc non Af Amer: >60 mL/min (ref >60)
GFR calc non Af Amer: >60 mL/min (ref >60)
GFR calc non Af Amer: >60 mL/min (ref >60)
Glucose, Bld: 107 mg/dL — ABNORMAL HIGH (ref 70–99)
Glucose, Bld: 111 mg/dL — ABNORMAL HIGH (ref 70–99)
Glucose, Bld: 111 mg/dL — ABNORMAL HIGH (ref 70–99)
Glucose, Bld: 112 mg/dL — ABNORMAL HIGH (ref 70–99)
Glucose, Bld: 117 mg/dL — ABNORMAL HIGH (ref 70–99)
Glucose, Bld: 128 mg/dL — ABNORMAL HIGH (ref 70–99)
Glucose, Bld: 133 mg/dL — ABNORMAL HIGH (ref 70–99)
Glucose, Bld: 135 mg/dL — ABNORMAL HIGH (ref 70–99)
Glucose, Bld: 139 mg/dL — ABNORMAL HIGH (ref 70–99)
Glucose, Bld: 142 mg/dL — ABNORMAL HIGH (ref 70–99)
Glucose, Bld: 152 mg/dL — ABNORMAL HIGH (ref 70–99)
Glucose, Bld: 166 mg/dL — ABNORMAL HIGH (ref 70–99)
Potassium: 2.8 mEq/L — ABNORMAL LOW (ref 3.5–5.1)
Potassium: 2.9 mEq/L — ABNORMAL LOW (ref 3.5–5.1)
Potassium: 3.1 mEq/L — ABNORMAL LOW (ref 3.5–5.1)
Potassium: 3.4 mEq/L — ABNORMAL LOW (ref 3.5–5.1)
Potassium: 3.4 mEq/L — ABNORMAL LOW (ref 3.5–5.1)
Potassium: 3.6 mEq/L (ref 3.5–5.1)
Potassium: 3.8 mEq/L (ref 3.5–5.1)
Potassium: 3.8 mEq/L (ref 3.5–5.1)
Potassium: 4.1 mEq/L (ref 3.5–5.1)
Potassium: 4.1 mEq/L (ref 3.5–5.1)
Potassium: 4.5 mEq/L (ref 3.5–5.1)
Sodium: 136 mEq/L (ref 135–145)
Sodium: 138 mEq/L (ref 135–145)
Sodium: 140 mEq/L (ref 135–145)
Sodium: 140 mEq/L (ref 135–145)
Sodium: 141 mEq/L (ref 135–145)
Sodium: 141 mEq/L (ref 135–145)
Sodium: 141 mEq/L (ref 135–145)
Sodium: 142 mEq/L (ref 135–145)
Sodium: 143 mEq/L (ref 135–145)
Sodium: 143 mEq/L (ref 135–145)
Sodium: 143 mEq/L (ref 135–145)
Sodium: 144 mEq/L (ref 135–145)
Sodium: 146 mEq/L — ABNORMAL HIGH (ref 135–145)
Sodium: 147 mEq/L — ABNORMAL HIGH (ref 135–145)

## 2010-08-31 LAB — CBC
HCT: 29.4 % — ABNORMAL LOW (ref 39.0–52.0)
HCT: 30.2 % — ABNORMAL LOW (ref 39.0–52.0)
HCT: 30.2 % — ABNORMAL LOW (ref 39.0–52.0)
HCT: 31 % — ABNORMAL LOW (ref 39.0–52.0)
HCT: 31.1 % — ABNORMAL LOW (ref 39.0–52.0)
HCT: 31.5 % — ABNORMAL LOW (ref 39.0–52.0)
HCT: 31.7 % — ABNORMAL LOW (ref 39.0–52.0)
HCT: 31.8 % — ABNORMAL LOW (ref 39.0–52.0)
HCT: 32.7 % — ABNORMAL LOW (ref 39.0–52.0)
HCT: 33.6 % — ABNORMAL LOW (ref 39.0–52.0)
HCT: 36.6 % — ABNORMAL LOW (ref 39.0–52.0)
HCT: 38.2 % — ABNORMAL LOW (ref 39.0–52.0)
Hemoglobin: 10.1 g/dL — ABNORMAL LOW (ref 13.0–17.0)
Hemoglobin: 10.2 g/dL — ABNORMAL LOW (ref 13.0–17.0)
Hemoglobin: 10.3 g/dL — ABNORMAL LOW (ref 13.0–17.0)
Hemoglobin: 10.5 g/dL — ABNORMAL LOW (ref 13.0–17.0)
Hemoglobin: 10.7 g/dL — ABNORMAL LOW (ref 13.0–17.0)
Hemoglobin: 10.8 g/dL — ABNORMAL LOW (ref 13.0–17.0)
Hemoglobin: 11 g/dL — ABNORMAL LOW (ref 13.0–17.0)
Hemoglobin: 12.1 g/dL — ABNORMAL LOW (ref 13.0–17.0)
Hemoglobin: 12.4 g/dL — ABNORMAL LOW (ref 13.0–17.0)
Hemoglobin: 9.2 g/dL — ABNORMAL LOW (ref 13.0–17.0)
Hemoglobin: 9.3 g/dL — ABNORMAL LOW (ref 13.0–17.0)
Hemoglobin: 9.6 g/dL — ABNORMAL LOW (ref 13.0–17.0)
Hemoglobin: 9.7 g/dL — ABNORMAL LOW (ref 13.0–17.0)
Hemoglobin: 9.7 g/dL — ABNORMAL LOW (ref 13.0–17.0)
Hemoglobin: 9.8 g/dL — ABNORMAL LOW (ref 13.0–17.0)
Hemoglobin: 9.9 g/dL — ABNORMAL LOW (ref 13.0–17.0)
MCH: 26 pg (ref 26.0–34.0)
MCH: 26.1 pg (ref 26.0–34.0)
MCH: 26.3 pg (ref 26.0–34.0)
MCH: 26.3 pg (ref 26.0–34.0)
MCH: 26.4 pg (ref 26.0–34.0)
MCH: 26.5 pg (ref 26.0–34.0)
MCH: 26.5 pg (ref 26.0–34.0)
MCH: 26.5 pg (ref 26.0–34.0)
MCH: 26.6 pg (ref 26.0–34.0)
MCH: 26.7 pg (ref 26.0–34.0)
MCH: 26.8 pg (ref 26.0–34.0)
MCH: 27 pg (ref 26.0–34.0)
MCH: 27 pg (ref 26.0–34.0)
MCH: 27.1 pg (ref 26.0–34.0)
MCHC: 30.6 g/dL (ref 30.0–36.0)
MCHC: 30.8 g/dL (ref 30.0–36.0)
MCHC: 31.1 g/dL (ref 30.0–36.0)
MCHC: 31.1 g/dL (ref 30.0–36.0)
MCHC: 31.1 g/dL (ref 30.0–36.0)
MCHC: 31.2 g/dL (ref 30.0–36.0)
MCHC: 31.3 g/dL (ref 30.0–36.0)
MCHC: 31.5 g/dL (ref 30.0–36.0)
MCHC: 31.6 g/dL (ref 30.0–36.0)
MCHC: 31.6 g/dL (ref 30.0–36.0)
MCHC: 31.8 g/dL (ref 30.0–36.0)
MCHC: 31.8 g/dL (ref 30.0–36.0)
MCHC: 31.8 g/dL (ref 30.0–36.0)
MCHC: 32 g/dL (ref 30.0–36.0)
MCHC: 32.2 g/dL (ref 30.0–36.0)
MCHC: 32.5 g/dL (ref 30.0–36.0)
MCHC: 32.7 g/dL (ref 30.0–36.0)
MCHC: 32.8 g/dL (ref 30.0–36.0)
MCHC: 32.9 g/dL (ref 30.0–36.0)
MCV: 82.8 fL (ref 78.0–100.0)
MCV: 83 fL (ref 78.0–100.0)
MCV: 83.1 fL (ref 78.0–100.0)
MCV: 83.2 fL (ref 78.0–100.0)
MCV: 83.5 fL (ref 78.0–100.0)
MCV: 83.6 fL (ref 78.0–100.0)
MCV: 83.6 fL (ref 78.0–100.0)
MCV: 83.8 fL (ref 78.0–100.0)
MCV: 84.1 fL (ref 78.0–100.0)
MCV: 84.2 fL (ref 78.0–100.0)
MCV: 85.3 fL (ref 78.0–100.0)
MCV: 85.3 fL (ref 78.0–100.0)
MCV: 85.4 fL (ref 78.0–100.0)
Platelets: 110 10*3/uL — ABNORMAL LOW (ref 150–400)
Platelets: 140 10*3/uL — ABNORMAL LOW (ref 150–400)
Platelets: 156 10*3/uL (ref 150–400)
Platelets: 184 10*3/uL (ref 150–400)
Platelets: 188 10*3/uL (ref 150–400)
Platelets: 210 10*3/uL (ref 150–400)
Platelets: 210 10*3/uL (ref 150–400)
Platelets: 212 10*3/uL (ref 150–400)
Platelets: 259 10*3/uL (ref 150–400)
Platelets: 259 10*3/uL (ref 150–400)
Platelets: 335 10*3/uL (ref 150–400)
Platelets: 340 10*3/uL (ref 150–400)
Platelets: 362 10*3/uL (ref 150–400)
RBC: 3.54 MIL/uL — ABNORMAL LOW (ref 4.22–5.81)
RBC: 3.54 MIL/uL — ABNORMAL LOW (ref 4.22–5.81)
RBC: 3.59 MIL/uL — ABNORMAL LOW (ref 4.22–5.81)
RBC: 3.66 MIL/uL — ABNORMAL LOW (ref 4.22–5.81)
RBC: 3.71 MIL/uL — ABNORMAL LOW (ref 4.22–5.81)
RBC: 3.74 MIL/uL — ABNORMAL LOW (ref 4.22–5.81)
RBC: 3.76 MIL/uL — ABNORMAL LOW (ref 4.22–5.81)
RBC: 3.81 MIL/uL — ABNORMAL LOW (ref 4.22–5.81)
RBC: 4.03 MIL/uL — ABNORMAL LOW (ref 4.22–5.81)
RDW: 14.4 % (ref 11.5–15.5)
RDW: 14.4 % (ref 11.5–15.5)
RDW: 14.5 % (ref 11.5–15.5)
RDW: 14.6 % (ref 11.5–15.5)
RDW: 14.8 % (ref 11.5–15.5)
RDW: 14.9 % (ref 11.5–15.5)
RDW: 14.9 % (ref 11.5–15.5)
RDW: 15.1 % (ref 11.5–15.5)
RDW: 15.1 % (ref 11.5–15.5)
RDW: 15.1 % (ref 11.5–15.5)
RDW: 15.3 % (ref 11.5–15.5)
RDW: 15.4 % (ref 11.5–15.5)
WBC: 10.9 10*3/uL — ABNORMAL HIGH (ref 4.0–10.5)
WBC: 11.3 10*3/uL — ABNORMAL HIGH (ref 4.0–10.5)
WBC: 11.6 10*3/uL — ABNORMAL HIGH (ref 4.0–10.5)
WBC: 12.4 10*3/uL — ABNORMAL HIGH (ref 4.0–10.5)
WBC: 12.8 10*3/uL — ABNORMAL HIGH (ref 4.0–10.5)
WBC: 12.9 10*3/uL — ABNORMAL HIGH (ref 4.0–10.5)
WBC: 12.9 10*3/uL — ABNORMAL HIGH (ref 4.0–10.5)
WBC: 14.6 10*3/uL — ABNORMAL HIGH (ref 4.0–10.5)
WBC: 6.6 10*3/uL (ref 4.0–10.5)
WBC: 7.8 10*3/uL (ref 4.0–10.5)
WBC: 9.9 10*3/uL (ref 4.0–10.5)

## 2010-08-31 LAB — COMPREHENSIVE METABOLIC PANEL
ALT: 34 U/L (ref 0–53)
Alkaline Phosphatase: 53 U/L (ref 39–117)
BUN: 36 mg/dL — ABNORMAL HIGH (ref 6–23)
CO2: 25 mEq/L (ref 19–32)
Chloride: 104 mEq/L (ref 96–112)
GFR calc non Af Amer: 41 mL/min — ABNORMAL LOW (ref >60)
Glucose, Bld: 126 mg/dL — ABNORMAL HIGH (ref 70–99)
Potassium: 5.2 mEq/L — ABNORMAL HIGH (ref 3.5–5.1)
Sodium: 140 mEq/L (ref 135–145)
Total Bilirubin: 0.8 mg/dL (ref 0.3–1.2)
Total Protein: 5.6 g/dL — ABNORMAL LOW (ref 6.0–8.3)

## 2010-08-31 LAB — URINALYSIS, ROUTINE W REFLEX MICROSCOPIC
Bilirubin Urine: NEGATIVE
Bilirubin Urine: NEGATIVE
Glucose, UA: NEGATIVE mg/dL
Glucose, UA: NEGATIVE mg/dL
Hgb urine dipstick: NEGATIVE
Ketones, ur: NEGATIVE mg/dL
Leukocytes, UA: NEGATIVE
Nitrite: NEGATIVE
Protein, ur: 30 mg/dL — AB
Specific Gravity, Urine: 1.021 (ref 1.005–1.030)
Specific Gravity, Urine: 1.026 (ref 1.005–1.030)
Urobilinogen, UA: 0.2 mg/dL (ref 0.0–1.0)
pH: 5.5 (ref 5.0–8.0)

## 2010-08-31 LAB — GLUCOSE, CAPILLARY
Glucose-Capillary: 100 mg/dL — ABNORMAL HIGH (ref 70–99)
Glucose-Capillary: 106 mg/dL — ABNORMAL HIGH (ref 70–99)
Glucose-Capillary: 114 mg/dL — ABNORMAL HIGH (ref 70–99)
Glucose-Capillary: 115 mg/dL — ABNORMAL HIGH (ref 70–99)
Glucose-Capillary: 118 mg/dL — ABNORMAL HIGH (ref 70–99)
Glucose-Capillary: 120 mg/dL — ABNORMAL HIGH (ref 70–99)
Glucose-Capillary: 121 mg/dL — ABNORMAL HIGH (ref 70–99)
Glucose-Capillary: 123 mg/dL — ABNORMAL HIGH (ref 70–99)
Glucose-Capillary: 124 mg/dL — ABNORMAL HIGH (ref 70–99)
Glucose-Capillary: 127 mg/dL — ABNORMAL HIGH (ref 70–99)
Glucose-Capillary: 132 mg/dL — ABNORMAL HIGH (ref 70–99)
Glucose-Capillary: 133 mg/dL — ABNORMAL HIGH (ref 70–99)
Glucose-Capillary: 135 mg/dL — ABNORMAL HIGH (ref 70–99)
Glucose-Capillary: 135 mg/dL — ABNORMAL HIGH (ref 70–99)
Glucose-Capillary: 135 mg/dL — ABNORMAL HIGH (ref 70–99)
Glucose-Capillary: 137 mg/dL — ABNORMAL HIGH (ref 70–99)
Glucose-Capillary: 137 mg/dL — ABNORMAL HIGH (ref 70–99)
Glucose-Capillary: 137 mg/dL — ABNORMAL HIGH (ref 70–99)
Glucose-Capillary: 138 mg/dL — ABNORMAL HIGH (ref 70–99)
Glucose-Capillary: 138 mg/dL — ABNORMAL HIGH (ref 70–99)
Glucose-Capillary: 139 mg/dL — ABNORMAL HIGH (ref 70–99)
Glucose-Capillary: 141 mg/dL — ABNORMAL HIGH (ref 70–99)
Glucose-Capillary: 142 mg/dL — ABNORMAL HIGH (ref 70–99)
Glucose-Capillary: 142 mg/dL — ABNORMAL HIGH (ref 70–99)
Glucose-Capillary: 144 mg/dL — ABNORMAL HIGH (ref 70–99)
Glucose-Capillary: 147 mg/dL — ABNORMAL HIGH (ref 70–99)
Glucose-Capillary: 147 mg/dL — ABNORMAL HIGH (ref 70–99)
Glucose-Capillary: 151 mg/dL — ABNORMAL HIGH (ref 70–99)
Glucose-Capillary: 152 mg/dL — ABNORMAL HIGH (ref 70–99)
Glucose-Capillary: 155 mg/dL — ABNORMAL HIGH (ref 70–99)
Glucose-Capillary: 156 mg/dL — ABNORMAL HIGH (ref 70–99)
Glucose-Capillary: 157 mg/dL — ABNORMAL HIGH (ref 70–99)
Glucose-Capillary: 162 mg/dL — ABNORMAL HIGH (ref 70–99)
Glucose-Capillary: 163 mg/dL — ABNORMAL HIGH (ref 70–99)
Glucose-Capillary: 163 mg/dL — ABNORMAL HIGH (ref 70–99)
Glucose-Capillary: 166 mg/dL — ABNORMAL HIGH (ref 70–99)
Glucose-Capillary: 170 mg/dL — ABNORMAL HIGH (ref 70–99)
Glucose-Capillary: 173 mg/dL — ABNORMAL HIGH (ref 70–99)
Glucose-Capillary: 176 mg/dL — ABNORMAL HIGH (ref 70–99)
Glucose-Capillary: 177 mg/dL — ABNORMAL HIGH (ref 70–99)
Glucose-Capillary: 180 mg/dL — ABNORMAL HIGH (ref 70–99)
Glucose-Capillary: 208 mg/dL — ABNORMAL HIGH (ref 70–99)
Glucose-Capillary: 93 mg/dL (ref 70–99)
Glucose-Capillary: 98 mg/dL (ref 70–99)

## 2010-08-31 LAB — POCT I-STAT 3, ART BLOOD GAS (G3+)
Acid-base deficit: 3 mmol/L — ABNORMAL HIGH (ref 0.0–2.0)
Acid-base deficit: 5 mmol/L — ABNORMAL HIGH (ref 0.0–2.0)
Bicarbonate: 21.6 mEq/L (ref 20.0–24.0)
Bicarbonate: 22.5 mEq/L (ref 20.0–24.0)
Bicarbonate: 30.5 mEq/L — ABNORMAL HIGH (ref 20.0–24.0)
O2 Saturation: 90 %
Patient temperature: 98.6
TCO2: 29 mmol/L (ref 0–100)
pCO2 arterial: 34.6 mmHg — ABNORMAL LOW (ref 35.0–45.0)
pCO2 arterial: 37.7 mmHg (ref 35.0–45.0)
pCO2 arterial: 38 mmHg (ref 35.0–45.0)
pCO2 arterial: 46.1 mmHg — ABNORMAL HIGH (ref 35.0–45.0)
pH, Arterial: 7.429 (ref 7.350–7.450)
pH, Arterial: 7.443 (ref 7.350–7.450)
pH, Arterial: 7.521 — ABNORMAL HIGH (ref 7.350–7.450)
pO2, Arterial: 201 mmHg — ABNORMAL HIGH (ref 80.0–100.0)
pO2, Arterial: 53 mmHg — ABNORMAL LOW (ref 80.0–100.0)
pO2, Arterial: 61 mmHg — ABNORMAL LOW (ref 80.0–100.0)
pO2, Arterial: 75 mmHg — ABNORMAL LOW (ref 80.0–100.0)
pO2, Arterial: 80 mmHg (ref 80.0–100.0)

## 2010-08-31 LAB — CULTURE, BLOOD (ROUTINE X 2)
Culture  Setup Time: 201112060904
Culture  Setup Time: 201112060904
Culture: NO GROWTH
Culture: NO GROWTH

## 2010-08-31 LAB — URINALYSIS, MICROSCOPIC ONLY
Bilirubin Urine: NEGATIVE
Glucose, UA: NEGATIVE mg/dL
Ketones, ur: NEGATIVE mg/dL
Leukocytes, UA: NEGATIVE
Nitrite: NEGATIVE
Protein, ur: NEGATIVE mg/dL
Specific Gravity, Urine: 1.011 (ref 1.005–1.030)
Urobilinogen, UA: 0.2 mg/dL (ref 0.0–1.0)
pH: 6 (ref 5.0–8.0)

## 2010-08-31 LAB — CROSSMATCH
ABO/RH(D): A POS
Antibody Screen: NEGATIVE
Unit division: 0
Unit division: 0

## 2010-08-31 LAB — DIFFERENTIAL
Basophils Absolute: 0 10*3/uL (ref 0.0–0.1)
Basophils Relative: 0 % (ref 0–1)
Basophils Relative: 0 % (ref 0–1)
Eosinophils Relative: 0 % (ref 0–5)
Lymphocytes Relative: 10 % — ABNORMAL LOW (ref 12–46)
Monocytes Absolute: 0.6 10*3/uL (ref 0.1–1.0)
Monocytes Relative: 5 % (ref 3–12)
Neutro Abs: 9 10*3/uL — ABNORMAL HIGH (ref 1.7–7.7)
Neutrophils Relative %: 83 % — ABNORMAL HIGH (ref 43–77)

## 2010-08-31 LAB — POCT I-STAT 4, (NA,K, GLUC, HGB,HCT)
Glucose, Bld: 149 mg/dL — ABNORMAL HIGH (ref 70–99)
Glucose, Bld: 166 mg/dL — ABNORMAL HIGH (ref 70–99)
Glucose, Bld: 167 mg/dL — ABNORMAL HIGH (ref 70–99)
HCT: 21 % — ABNORMAL LOW (ref 39.0–52.0)
HCT: 23 % — ABNORMAL LOW (ref 39.0–52.0)
HCT: 30 % — ABNORMAL LOW (ref 39.0–52.0)
Hemoglobin: 12.9 g/dL — ABNORMAL LOW (ref 13.0–17.0)
Hemoglobin: 7.1 g/dL — ABNORMAL LOW (ref 13.0–17.0)
Hemoglobin: 7.8 g/dL — ABNORMAL LOW (ref 13.0–17.0)
Hemoglobin: 9.5 g/dL — ABNORMAL LOW (ref 13.0–17.0)
Potassium: 3.6 mEq/L (ref 3.5–5.1)
Potassium: 5 mEq/L (ref 3.5–5.1)
Sodium: 138 mEq/L (ref 135–145)
Sodium: 140 mEq/L (ref 135–145)
Sodium: 142 mEq/L (ref 135–145)

## 2010-08-31 LAB — BODY FLUID CULTURE

## 2010-08-31 LAB — POCT I-STAT, CHEM 8
BUN: 26 mg/dL — ABNORMAL HIGH (ref 6–23)
BUN: 38 mg/dL — ABNORMAL HIGH (ref 6–23)
Calcium, Ion: 1.12 mmol/L (ref 1.12–1.32)
Chloride: 107 mEq/L (ref 96–112)
Chloride: 110 mEq/L (ref 96–112)
Creatinine, Ser: 1.1 mg/dL (ref 0.4–1.5)
Glucose, Bld: 175 mg/dL — ABNORMAL HIGH (ref 70–99)
HCT: 31 % — ABNORMAL LOW (ref 39.0–52.0)
HCT: 31 % — ABNORMAL LOW (ref 39.0–52.0)
Hemoglobin: 10.5 g/dL — ABNORMAL LOW (ref 13.0–17.0)
Potassium: 4.1 mEq/L (ref 3.5–5.1)
Potassium: 4.8 mEq/L (ref 3.5–5.1)
Sodium: 143 mEq/L (ref 135–145)
Sodium: 144 mEq/L (ref 135–145)
TCO2: 24 mmol/L (ref 0–100)

## 2010-08-31 LAB — BLOOD GAS, ARTERIAL
Bicarbonate: 24.2 mEq/L — ABNORMAL HIGH (ref 20.0–24.0)
FIO2: 0.21 %
O2 Saturation: 81.3 %
Patient temperature: 98.6

## 2010-08-31 LAB — EXPECTORATED SPUTUM ASSESSMENT W GRAM STAIN, RFLX TO RESP C

## 2010-08-31 LAB — POCT I-STAT GLUCOSE: Glucose, Bld: 121 mg/dL — ABNORMAL HIGH (ref 70–99)

## 2010-08-31 LAB — PHOSPHORUS
Phosphorus: 2.7 mg/dL (ref 2.3–4.6)
Phosphorus: 3.6 mg/dL (ref 2.3–4.6)

## 2010-08-31 LAB — URINE CULTURE
Colony Count: NO GROWTH
Colony Count: NO GROWTH
Culture  Setup Time: 201111271756
Culture  Setup Time: 201112060904
Culture: NO GROWTH
Culture: NO GROWTH

## 2010-08-31 LAB — POCT CARDIAC MARKERS
Myoglobin, poc: 281 ng/mL (ref 12–200)
Troponin i, poc: 0.09 ng/mL (ref 0.00–0.09)

## 2010-08-31 LAB — MAGNESIUM
Magnesium: 2.6 mg/dL — ABNORMAL HIGH (ref 1.5–2.5)
Magnesium: 2.9 mg/dL — ABNORMAL HIGH (ref 1.5–2.5)
Magnesium: 3.1 mg/dL — ABNORMAL HIGH (ref 1.5–2.5)

## 2010-08-31 LAB — URINE MICROSCOPIC-ADD ON

## 2010-08-31 LAB — PLATELET INHIBITION P2Y12: Platelet Function  P2Y12: 240 [PRU] (ref 194–418)

## 2010-08-31 LAB — HEMOGLOBIN AND HEMATOCRIT, BLOOD: Hemoglobin: 8 g/dL — ABNORMAL LOW (ref 13.0–17.0)

## 2010-08-31 LAB — CREATININE, SERUM
Creatinine, Ser: 1.38 mg/dL (ref 0.4–1.5)
Creatinine, Ser: 2.21 mg/dL — ABNORMAL HIGH (ref 0.4–1.5)
GFR calc non Af Amer: 30 mL/min — ABNORMAL LOW (ref >60)
GFR calc non Af Amer: 51 mL/min — ABNORMAL LOW (ref >60)

## 2010-08-31 LAB — CULTURE, RESPIRATORY W GRAM STAIN

## 2010-08-31 LAB — PLATELET COUNT: Platelets: 140 10*3/uL — ABNORMAL LOW (ref 150–400)

## 2010-08-31 LAB — TYPE AND SCREEN
ABO/RH(D): A POS
Antibody Screen: NEGATIVE

## 2010-08-31 LAB — HEPARIN LEVEL (UNFRACTIONATED)
Heparin Unfractionated: 0.18 IU/mL — ABNORMAL LOW (ref 0.30–0.70)
Heparin Unfractionated: 0.2 IU/mL — ABNORMAL LOW (ref 0.30–0.70)
Heparin Unfractionated: 0.36 IU/mL (ref 0.30–0.70)

## 2010-08-31 LAB — ANAEROBIC CULTURE

## 2010-08-31 LAB — BRAIN NATRIURETIC PEPTIDE: Pro B Natriuretic peptide (BNP): 2278 pg/mL — ABNORMAL HIGH (ref 0.0–100.0)

## 2010-08-31 LAB — MRSA PCR SCREENING: MRSA by PCR: NEGATIVE

## 2010-08-31 NOTE — Assessment & Plan Note (Signed)
Summary: f79m/dfg/ep  Medications Added RANITIDINE HCL 150 MG CAPS (RANITIDINE HCL) Take 1 tablet by mouth two times a day FUROSEMIDE 40 MG TABS (FUROSEMIDE) Take one tablet by mouth daily for swelling. When swelling gone take one daily as needed POTASSIUM CHLORIDE CRYS CR 20 MEQ CR-TABS (POTASSIUM CHLORIDE CRYS CR) Take one tablet by mouth daily while taking furosemide      Allergies Added:   Visit Type:  69mo follow up Primary Provider:  Merrily Pew  CC:  left arm numbness since surgery.  pt says he doesnt feel well, fatigue, and joint pain.Marland Kitchen  History of Present Illness: Mr Aaron [name] returns for edema. He has had problems with this since his CABG. It is mostly in the RLE where grafts were taken. Gets better by morning. He is anxious to be increasing exercise. Surgery has released him. Went home post op on 40mg  of Lasix and KCL.  Has had some post left sided phantom discomfort.  Current Medications (verified): 1)  Amiloride Hcl 5 Mg Tabs (Amiloride Hcl) .Marland Kitchen.. 1 Tab Once Daily 2)  Metoprolol Tartrate 25 Mg Tabs (Metoprolol Tartrate) .Marland Kitchen.. 1 Tab Two Times A Day 3)  Exforge 10-320 Mg Tabs (Amlodipine Besylate-Valsartan) .Marland Kitchen.. 1 Tab Once Daily 4)  Tricor 145 Mg Tabs (Fenofibrate) .Marland Kitchen.. 1 Tab Once Daily 5)  Clonidine Hcl 0.1 Mg Tabs (Clonidine Hcl) .Marland Kitchen.. 1 Tab Two Times A Day 6)  Mucinex 600 Mg Xr12h-Tab (Guaifenesin) .... 2 Tabs Once Daily 7)  Lipitor 40 Mg Tabs (Atorvastatin Calcium) .... Take 1 Tablet At Bedtime 8)  Tekturna 150 Mg Tabs (Aliskiren Fumarate) .Marland Kitchen.. 1 Tab Once Daily 9)  Lansoprazole 30 Mg Cpdr (Lansoprazole) .... Take 1 Tablet Daily 10)  Nitrostat 0.4 Mg Subl (Nitroglycerin) .Marland Kitchen.. 1 Tablet Under Tongue At Onset of Chest Pain; You May Repeat Every 5 Minutes For Up To 3 Doses. 11)  Aspirin 81 Mg Tbec (Aspirin) .... Take One Tablet By Mouth Daily 12)  Plavix 75 Mg Tabs (Clopidogrel Bisulfate) .... Take One Tablet By Mouth Daily 13)  Ranitidine Hcl 150 Mg Caps (Ranitidine Hcl) ....  Take 1 Tablet By Mouth Two Times A Day  Allergies (verified): 1)  ! Sulfa  Past History:  Past Medical History: Last updated: 10/08/2009 CAD, NATIVE VESSEL (ICD-414.01) RENAL ARTERY STENOSIS (ICD-440.1) CAROTID ARTERY STENOSIS, WITHOUT INFARCTION (ICD-433.10) HYPERTENSION, UNSPECIFIED (ICD-401.9) HYPERLIPIDEMIA-MIXED (ICD-272.4)    Past Surgical History: Last updated: 06/18/2010 Total prostatectomy CABG X 3.....05/12/10 Sigmoid colectomy End colostomy Repair of umbilical hernia  Family History: Last updated: 11/01/2008 Family History of Coronary Artery Disease:   Social History: Last updated: 11/01/2008 Full Time Married  Tobacco Use - Former. 1970's Alcohol Use - no Regular Exercise - yes Drug Use - no  Risk Factors: Exercise: yes (11/01/2008)  Risk Factors: Smoking Status: quit (11/01/2008)  Review of Systems       NEGATIVE OTHER THAN HPI  Vital Signs:  Patient profile:   70 year old male Height:      66 inches Weight:      180 pounds BMI:     29.16 Pulse rate:   68 / minute Resp:     18 per minute BP sitting:   142 / 82  (left arm) Cuff size:   large  Vitals Entered By: Celestia Khat, CMA (August 23, 2010 11:30 AM)  Physical Exam  General:  obese.  obese.   Head:  normocephalic and atraumatic Eyes:  PERRLA/EOM intact; conjunctiva and lids normal. Neck:  Neck supple, no JVD. No  masses, thyromegaly or abnormal cervical nodes. Chest Kenyatte Gruber:  no deformities or breast masses noted Lungs:  Clear bilaterally to auscultation and percussion. Heart:  RRR, NL S1 AND S2, NO SIGN MURMUR., NO BRUITS Msk:  decreased ROM.  decreased ROM.   Pulses:  pulses normal in all 4 extremities Extremities:  No clubbing or cyanosis. Neurologic:  Alert and oriented x 3. Skin:  Intact without lesions or rashes. Psych:  Normal affect.   Problems:  Medical Problems Added: 1)  Dx of Edema  (ICD-782.3) 2)  Dx of Edema  (ICD-782.3)  Impression &  Recommendations:  Problem # 1:  EDEMA (ICD-782.3) Assessment Deteriorated Restart Lasix 40mg  until edema resolves then as needed. KCL with lasix.  Problem # 2:  ATRIAL FIBRILLATION (ICD-427.31) Assessment: Improved  His updated medication list for this problem includes:    Metoprolol Tartrate 25 Mg Tabs (Metoprolol tartrate) .Marland Kitchen... 1 tab two times a day    Aspirin 81 Mg Tbec (Aspirin) .Marland Kitchen... Take one tablet by mouth daily    Plavix 75 Mg Tabs (Clopidogrel bisulfate) .Marland Kitchen... Take one tablet by mouth daily  Problem # 3:  PALPITATIONS (ICD-785.1) Assessment: Improved  His updated medication list for this problem includes:    Metoprolol Tartrate 25 Mg Tabs (Metoprolol tartrate) .Marland Kitchen... 1 tab two times a day    Exforge 10-320 Mg Tabs (Amlodipine besylate-valsartan) .Marland Kitchen... 1 tab once daily    Nitrostat 0.4 Mg Subl (Nitroglycerin) .Marland Kitchen... 1 tablet under tongue at onset of chest pain; you may repeat every 5 minutes for up to 3 doses.    Aspirin 81 Mg Tbec (Aspirin) .Marland Kitchen... Take one tablet by mouth daily    Plavix 75 Mg Tabs (Clopidogrel bisulfate) .Marland Kitchen... Take one tablet by mouth daily  Problem # 4:  CAD, NATIVE VESSEL (ICD-414.01) Assessment: Unchanged  His updated medication list for this problem includes:    Metoprolol Tartrate 25 Mg Tabs (Metoprolol tartrate) .Marland Kitchen... 1 tab two times a day    Exforge 10-320 Mg Tabs (Amlodipine besylate-valsartan) .Marland Kitchen... 1 tab once daily    Nitrostat 0.4 Mg Subl (Nitroglycerin) .Marland Kitchen... 1 tablet under tongue at onset of chest pain; you may repeat every 5 minutes for up to 3 doses.    Aspirin 81 Mg Tbec (Aspirin) .Marland Kitchen... Take one tablet by mouth daily    Plavix 75 Mg Tabs (Clopidogrel bisulfate) .Marland Kitchen... Take one tablet by mouth daily  Problem # 5:  RENAL ARTERY STENOSIS (ICD-440.1) Assessment: Unchanged  Problem # 6:  HYPERTENSION, UNSPECIFIED (ICD-401.9) Assessment: Improved  His updated medication list for this problem includes:    Amiloride Hcl 5 Mg Tabs  (Amiloride hcl) .Marland Kitchen... 1 tab once daily    Metoprolol Tartrate 25 Mg Tabs (Metoprolol tartrate) .Marland Kitchen... 1 tab two times a day    Exforge 10-320 Mg Tabs (Amlodipine besylate-valsartan) .Marland Kitchen... 1 tab once daily    Clonidine Hcl 0.1 Mg Tabs (Clonidine hcl) .Marland Kitchen... 1 tab two times a day    Tekturna 150 Mg Tabs (Aliskiren fumarate) .Marland Kitchen... 1 tab once daily    Aspirin 81 Mg Tbec (Aspirin) .Marland Kitchen... Take one tablet by mouth daily    Furosemide 40 Mg Tabs (Furosemide) .Marland Kitchen... Take one tablet by mouth daily for swelling. when swelling gone take one daily as needed  Problem # 7:  CAROTID ARTERY STENOSIS, WITHOUT INFARCTION (ICD-433.10) Assessment: Unchanged  His updated medication list for this problem includes:    Aspirin 81 Mg Tbec (Aspirin) .Marland Kitchen... Take one tablet by mouth daily    Plavix  75 Mg Tabs (Clopidogrel bisulfate) .Marland Kitchen... Take one tablet by mouth daily  Patient Instructions: 1)  Your physician recommends that you schedule a follow-up appointment in: 3 months with Dr. Daleen Squibb 2)  Your physician has recommended you make the following change in your medication: Start furosemide 40 mg by mouth daily for swelling.  Once swelling gone take daily as needed 3)  Take potassium 20 meq daily while taking furosemide. Prescriptions: POTASSIUM CHLORIDE CRYS CR 20 MEQ CR-TABS (POTASSIUM CHLORIDE CRYS CR) Take one tablet by mouth daily while taking furosemide  #30 x 2   Entered by:   Dossie Arbour, RN, BSN   Authorized by:   Gaylord Shih, MD, [facility]   Signed by:   Dossie Arbour, RN, BSN on 08/23/2010   Method used:   Electronically to        [facility] (retail)       [address]       [place], [place]  [address]       Ph: [phone]       Fax: [phone]   RxID:   [id number] FUROSEMIDE 40 MG TABS (FUROSEMIDE) Take one tablet by mouth daily for swelling. When swelling gone take one daily as needed  #30 x 2   Entered by:   Dossie Arbour, RN, BSN    Authorized by:   Gaylord Shih, MD, [facility]   Signed by:   Dossie Arbour, RN, BSN on 08/23/2010   Method used:   Electronically to        [facility] (retail)       [address]       [place], [place]  [address]       Ph: [phone]       Fax: [phone]   RxID:   [id number]

## 2010-09-29 ENCOUNTER — Encounter: Payer: Self-pay | Admitting: Cardiology

## 2010-10-28 ENCOUNTER — Telehealth: Payer: Self-pay | Admitting: *Deleted

## 2010-10-28 NOTE — Telephone Encounter (Signed)
Pt  Had appt with PA [name] next week for surgical clearance.  Dr. Daleen Squibb has reviewed information and pt was cleared for surgery.  He will stop Plavix 5 days prior. I spoke with pt today and he  Denies any cardiac problems at this time. He would like this clearance also faxed to Dr. Janee Morn. Mylo Red RN

## 2010-10-29 ENCOUNTER — Other Ambulatory Visit (HOSPITAL_COMMUNITY): Payer: Self-pay | Admitting: General Surgery

## 2010-10-29 DIAGNOSIS — Z433 Encounter for attention to colostomy: Secondary | ICD-10-CM

## 2010-11-01 ENCOUNTER — Other Ambulatory Visit (HOSPITAL_COMMUNITY): Payer: BC Managed Care – PPO

## 2010-11-02 ENCOUNTER — Ambulatory Visit (HOSPITAL_COMMUNITY)
Admission: RE | Admit: 2010-11-02 | Discharge: 2010-11-02 | Disposition: A | Discharge status: Home / Self Care | Payer: Medicare Other | Source: Ambulatory Visit | Attending: General Surgery | Admitting: General Surgery

## 2010-11-02 DIAGNOSIS — K5730 Diverticulosis of large intestine without perforation or abscess without bleeding: Secondary | ICD-10-CM | POA: Insufficient documentation

## 2010-11-02 DIAGNOSIS — Z433 Encounter for attention to colostomy: Secondary | ICD-10-CM | POA: Insufficient documentation

## 2010-11-02 NOTE — Assessment & Plan Note (Signed)
[facility]                            CARDIOLOGY OFFICE NOTE   PETAR, MUCCI                       MRN:          [id number]  DATE:10/09/2007                            DOB:          September 09, 1940    Mr. Aaron [name] returns today after undergoing outpatient cardiac  catheterization. This showed very high central aortic pressure of  195/65, mean of 110, left ventricular pressure of 197/17. The left main  was free of disease.  The LAD had diffuse segmental plaquing.  He had  about a 50-60% stenosis at one point.  The second diagonal was a  moderate size vessel and had a mild ostial narrowing and about a 40%  area of stenosis as well.  The mid distal LAD had about a 30-40%  narrowing.  The circumflex proved a large marginal branch which was  totally occluded where the old stent was placed.  He had some retrograde  filling of the circumflex territory.  The right coronary artery was a  fairly large vessel.  There was a 30-40% proximal narrowing and a 30%  mid narrowing.  He had a widely patent stent. He had some  collateralization of the distal vessel.   His left ventricular function is about 45-50%. We did not purposely take  a left ventriculogram because we wanted to spare his kidney function.  His creatinine baseline about 1.5.   Since discharge, he now says that he has some chest burning when he  pushes himself walking or doing the elliptical trainer.  Other  activities such as cutting grass is not a focus.   MEDICATION:  Unchanged since last visit.  Please refer to the  maintenance medication list.   His blood pressure today is 187/72, his pulse 52 and regular, his weight  is 169.  HEENT:  Unchanged.  Carotids are full.  Thyroid is not enlarged.  Trachea is midline.  LUNGS:  Clear.  HEART:  Reveals a slow rate and rhythm.  ABDOMEN:  Soft, good bowel sounds.  EXTREMITIES:  Reveal no edema.  Pulses are present.  NEUROLOGIC:  Intact.   I had a  long talk with Mr. lopezgarcia today.  He clearly has exertional  symptoms which are anginal described as a chest burning.  I have told  him that when he has this to slow down, wait until they abate and then  restart.  This actually will help produce increased collateralization to  the distal circ.   Will check a creatinine today to make sure that this is stable about 1.5  or so.  I will see him back again in several months.  He will stay on  aspirin and Plavix.  We have made no other changes to his medications.     Thomas C. Daleen Squibb, MD, [facility]  Electronically Signed    TCW/MedQ  DD: 10/09/2007  DT: 10/09/2007  Job #: [id number]

## 2010-11-02 NOTE — Assessment & Plan Note (Signed)
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  He had a 90% left main stenosis.  He underwent coronary bypass grafting  x3 on May 12, 2010.  He was progressing well up until the night  before he scheduled for discharge when he developed acute abdominal  pain.  An x-ray showed free air.  He was taken to the operating room  where he was found to have a perforated sigmoid colon.  He underwent  sigmoid colectomy and colostomy with a Hartmann pouch for perforated  diverticulitis.  Following that procedure, he did extremely well and  fortunately did not develop any severe sepsis or multisystem organ  dysfunction.  He ultimately was discharged home on May 28, 2010.  He  now returns for followup.  He states he is doing well.  He says his  appetite is good.  He has lost about 10 pounds since prior to surgery.  He has not had any difficulties with his colostomy.  He says he saw Dr.  Janee Morn last week and his abdominal wound is healing well.  He is not  having any significant pain. He is not having to take any narcotics and  overall, he is very pleased with his progress.  He has been walking.  He  is very anxious to return to work.   CURRENT MEDICATIONS:  1. Amiloride 5 mg daily.  2. Metoprolol 25 mg b.i.d.  3. Exforge 10/320 daily.  4. Clonidine 0.1 mg b.i.d.  5. Tekturna 150 mg daily.  6. Plavix has been stopped.  7. Lipitor 40 mg daily.  8. TriCor 145 mg daily.  9. Lansoprazole 30 mg daily.  10.Mucinex 400 mg b.i.d. p.r.n.   Aspirin was discontinued with his GI complaints.   PHYSICAL EXAMINATION:  General:  The patient is a well-appearing 69-year-  old gentleman, in no acute distress.  Vital Signs:  His blood pressure  is  180/79, pulse 56, respirations are 18, and his oxygen saturation is  98% on room air.  Neurological:  He is alert and oriented x3 with no  focal deficits.  HEENT:  Unremarkable.  Lungs:  Clear with equal breath  sounds bilaterally.  Cardiac:  Regular rate and rhythm.  Normal S1 and  S2.  No rubs, murmurs, or gallops.  Chest:  His sternum is stable.  Sternal incision is clean, dry, and intact.  Abdomen:  His abdominal  incision still has some areas that are packed, but granulating, I did  not take down the entire dressing.  Extremities:  He does have 1+ edema  in his right lower extremity, trace in the left lower extremity, he says  this is unchanged since his operation.   Chest x-ray shows good aeration of the lungs bilaterally.   IMPRESSION:  The patient is a 70 year old gentleman status post coronary  bypass grafting.  He had a perforated diverticulitis postoperatively.  Dr. Violeta Gelinas did a sigmoid colectomy and colostomy on him.  He is  going to wait about 6 months to take that down.  From a cardiac  standpoint, the patient is doing extremely well.  His exercise tolerance  is good.  I told him to go ahead and continue to build into that.  I  encouraged him to do the cardiac rehab program, may be starting in a  couple of weeks.  He is not to lift anything over 10 pounds for another  week and nothing over 20 pounds for another 3 weeks.  He may begin  driving.  Appropriate precautions were discussed.  He had questions  about returning to work and I told him he could start easing into it in  about mid January, but I did not want him working full-time right off  the bed, but he certainly could start getting back into the duties to  see how things progressed.  He has an appointment with Dr. Daleen Squibb next  Wednesday.  I did not make any changes to his medication regimen given  the isolated blood pressure finding today.  I do think that he will need  to go on ati-platelet therapy at some  point, but may be a couple of more  weeks until his gastrointestinal situation is settled.   Aaron [name], M.D.  Electronically Signed   SCH/MEDQ  D:  06/22/2010  T:  06/23/2010  Job:  [id number]   cc:   Thomas C. Wall, MD, [facility]  Gabrielle Dare. Janee Morn, M.D.  [name], MD

## 2010-11-02 NOTE — Assessment & Plan Note (Signed)
[facility]                            CARDIOLOGY OFFICE NOTE   TYRIEK, HOFMAN                       MRN:          [id number]  DATE:09/26/2007                            DOB:          Aug 11, 1940    CHIEF COMPLAINT:  Abnormal stress test.   HISTORY OF PRESENT ILLNESS:  Mr. Aaron [name] is a delightful 70-year-  old gentleman with known coronary disease.  He has a history of two drug-  eluting stents, one in the right coronary and one in the circumflex by  Dr. Karleen Hampshire at [facility] October 16, 2006.  He has only had  some minimal dyspnea on exertion.   We performed a stress Myoview for follow-up in the form of an adenosine  study on September 25, 2007.  This showed moderate peri-infarct ischemia over  a small basal lateral wall infarct involving the entire lateral anterior  lateral wall from apex to base.  EF was 46%.   PAST MEDICAL HISTORY:  He had an acute left anterior thalamic stroke  manifested by sudden onset of slurring of speech and right-sided  weakness treated at [facility] March 07, 2007.  He has been  on Plavix and aspirin ever since.  He also has a history of severe  recalcitrant hypertension which has had a negative workup for secondary  causes throughout the years.  He had been followed in the past by Dr.  Nanetta Batty who placed a left renal artery stent and also Dr. Ether Griffins.  He has had an ultrasound in the past year which shows the  renal arteries to be patent with a 60% non-flow limiting lesion  bilaterally.  His kidney sizes are relatively normal.  Surprisingly, his  creatinine has been normal as well.  He has a history of hyperlipidemia.   HE HAS A HISTORY OF SULFA ALLERGY.   CURRENT MEDICATIONS:  1. Amiloride 5 mg 1 daily.  2. Toprol XL 25 mg a day.  3. Exforge 10/320 daily.  4. Plavix 75 mg a day.  5. Aspirin 325 mg a day.  6. TriCor 145 mg a day.  7. Clonidine 0.1 mg p.o. b.i.d.  8.  Mucinex 800 mg a day.  9. Simvastatin 40 mg a day.  10.Tekturna 150 mg daily.  11.Omeprazole 20 mg a day.   SURGICAL HISTORY:  Total prostatectomy.   FAMILY HISTORY:  Is remarkable for coronary disease.   SOCIAL HISTORY:  Is a Education officer, environmental at [employer].  He works  extremely hard and does not want to quit.  He gets extremely emotionally  involved with his members, and this drives his blood pressure up always.  He is married.   REVIEW OF SYSTEMS:  He has seasonal allergies.  He has chronic fatigue.  Apparently, he has had an ulcer the past, hiatal hernia,  gastroesophageal reflux, sexual dysfunction, urinary problems with  urinary continence since his surgery, depression, history of skin  cancer.   PHYSICAL EXAMINATION:  He is very pleasant.  His blood pressure is  142/80, pulse is 60 and regular.  He is  about 166 pounds.  HEENT:  Ruddy complexion.  PERRL.  Extraocular movements intact.  Sclera  clear.  Face symmetry is normal.  Carotid upstrokes are equal  bilaterally without bruits.  No JVD.  Thyroid is not enlarged.  Trachea  is midline.  LUNGS:  Are clear.  HEART:  Reveals a nondisplaced PMI.  Normal S1-S2 with an S4.  He has  soft systolic murmur at the apex and left sternal border.  S2 splits  physiologically.  ABDOMINAL EXAM:  Soft with no midline or flank bruit.  EXTREMITIES:  Revealed no cyanosis, clubbing, or edema.  Toes were cool,  slight delay in capillary reflux.  No sign of DVT.  NEUROLOGICAL EXAM:  Is intact.  SKIN:  Is unremarkable.   ASSESSMENT:  1. History of drug-eluting stent to circumflex and right coronary      artery as outlined above, now with a very positive stress Myoview      with lateral and anterior lateral wall ischemia and small lateral      wall infarct.  He has mild reduction in left ventricular systolic      function.  2. Other problems as mentioned above.   PLAN:  Outpatient JV catheterization tomorrow.  Will draw baseline  labs,  but again, his last creatinine was 0.9.  Indications, risks, and  potential benefits have been discussed.  He has no dye allergy.  He does  have a sulfa allergy.     Thomas C. Daleen Squibb, MD, [facility]  Electronically Signed    TCW/MedQ  DD: 09/26/2007  DT: 09/26/2007  Job #: [id number]   cc:   Barney Drain, M.D.

## 2010-11-02 NOTE — Assessment & Plan Note (Signed)
[facility]                            CARDIOLOGY OFFICE NOTE   KAM, RAHIMI                       MRN:          [id number]  DATE:04/06/2007                            DOB:          January 03, 1941    I was asked by Dr. Barney Drain to consult on Aaron [name] with  severe refractory hypertension.   HISTORY OF PRESENT ILLNESS:  Mr. Castrogiovanni is a very pleasant 70 year old  married white male, [employer], who has had the diagnosis of  hypertension since age 23.  Per his record, he had a left renal artery  stent by Dr. Nanetta Batty about 10 years ago.  He was being managed as  well by Dr. Fidela Salisbury.  Even after his renal artery stent, his  blood pressure averaged around 150 systolic with normal diastolics.   He has multiple drug intolerances including a SULFA ALLERGY, severe  problems with hypokalemia with diuretics as well, specifically, Maxzide  is mentioned, ACE inhibitor-induced cough, hydralazine caused  palpitations.  He is currently on amiloride 5 mg a day as a diuretic,  Toprol-XL 25 mg a day, and a combination of Norvasc and Diovan at 10/320  mg a day.  He has been on this medication program for about 2 months.   His blood pressures are running now anywhere from 180 to 220 systolic.  They are higher in the morning.  He takes all of his medication in the  morning.   PAST MEDICAL HISTORY:  Complicated by the fact that he just had an acute  left anterior thalamic stroke manifested by the sudden onset of slurring  of his speech and right-sided weakness.  This was treated at [facility] from September 17 to September 20.  He was placed back on  Plavix and also his aspirin.  He stopped his Plavix apparently on his  own.  He was on Plavix for two drug-eluting stents, one in the right  coronary artery and one in the circumflex by Dr. Karleen Hampshire October 16, 2006.  His LAD was free of significant disease.  He had an EF of 40  to  45% with mild global hypokinesia.   His past medical history is also significant for:  1. Gout.  2. Depression.  3. Chronic sinusitis.  4. Degenerative joint disease.  5. Insomnia.   He has had problems with prostate CA well as BPH.   SURGICAL HISTORY:  Total prostatectomy.   CURRENT MEDS:  1. Amiloride 5 mg a day.  2. Toprol-XL 25 mg a day.  3. Omeprazole 20 mg p.r.n.  4. Amlodipine/Diovan 10/320 daily.  5. Plavix 75 mg a day.  6. Aspirin 325 mg.  7. Simvastatin 20 mg a day.  8. TriCor 145 mg a day.   He does not smoke.  He quit in the 70s.  He does drink a couple of  caffeinated beverages a day.  He used to walk on a regular basis until  he had a stroke.   FAMILY HISTORY:  Remarkable for his mother and father dying in their 55s  of  heart disease and myocardial infarction.  He had a sister, age 73,  who died of myocardial infarction.   SOCIAL HISTORY:  He is a Education officer, environmental at [employer].  He has a  passion for his work and does not want to quit.  He is married.   REVIEW OF SYSTEMS:  He has SEASONAL ALLERGIES.  He has chronic fatigue.  He has apparently had an ulcer in the past, hiatal hernia,  gastroesophageal reflux, sexual dysfunction, urinary problems with  urinary incontinence since his surgery, depression and a history of skin  cancer.   EXAM:  A very pleasant gentleman in no acute distress.  He is alert and  oriented, his skin is warm and dry, he has a ruddy complexion of the  face.  He is 153 pounds, his blood pressure today is 202/84 in the left  arm, pulse is 54 and regular.  His electrocardiogram shows normal sinus  rhythm with LVH.  EKG from the outside shows some ST segment depression  with T wave inversion in 1 and AVL.  HEENT:  Normocephalic, atraumatic, PERRLA, extraocular movements are  intact, sclera clear, facial symmetry is normal.  Carotid upstrokes were  equal bilaterally without obvious bruits.  Thyroid is not enlarged,  trachea is  midline.  LUNGS:  Clear.  HEART:  Reveals a nondisplaced PMI, he has a normal S1 S2 with an S4, he  has a soft systolic murmur along the left sternal border.  S2 splits.  ABDOMINAL EXAM:  Soft with no midline or flank bruit.  EXTREMITIES:  No clubbing, cyanosis or edema, pulses are present, his  toes are cool with slight delay in capillary reflex.  There is no sign  of DVT.  NEURO EXAM:  Intact grossly.  SKIN:  Unremarkable.   ASSESSMENT:  1. 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  2. Multiple antihypertensive drug intolerances.  3. Coronary artery disease status post two drug-eluting stents in the      right coronary artery and circumflex April, 2008.  4. History of recent hypertensive cerebrovascular accident of the left      thalamus.  5. Mixed hyperlipidemia.  6. Gastroesophageal reflux.  7. Strong family history of coronary disease.  8. SULFA ALLERGY.   PLAN:  1. The patient says he had a renal ultrasound to rule out renal artery      stenosis about a week or so ago at [facility] in [facility].  We will obtain these records.  If there is any technical      inadequacy or question about it whatsoever, will pursue with a      gadolinium renal arteriogram.  We need to get baseline renal      function regardless.  2. Try to obtain old records from Dr. Briant Cedar for insight into how      to control his blood pressure.  3. Begin clonidine 0.1 mg p.o. b.i.d. to increase to maximum dose as      needed.  4. I am going to see him back in about 2 weeks.  Hopefully by that      time will have some information, we can pursue a more aggressive      course.     Thomas C. Daleen Squibb, MD, [facility]  Electronically Signed    TCW/MedQ  DD: 04/06/2007  DT: 04/08/2007  Job #: [id number]  cc:   Barney Drain, M.D.  Karleen Hampshire, M.D.

## 2010-11-02 NOTE — Assessment & Plan Note (Signed)
[facility]                            CARDIOLOGY OFFICE NOTE   HARDEEP, REETZ                       MRN:          [id number]  DATE:12/31/2007                            DOB:          [birth year]    HISTORY OF PRESENT ILLNESS:  Mr. Upperman returns today for further  management of the following issues.  Coronary artery disease.  Before he has had no angina, so the  collateralization to his distal circumflex marginal seems to be holding  out.  Please see the cath note and my last note from October 09, 2007.   His blood pressure has been running about 150 systolic, which is not bad  for him.   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  He gets extremely emotional as  pointed out in my previous notes, and his blood pressure shoots up.   He was dizzy and stopped his clonidine.  His blood pressure shot back  up.  He went back on clonidine 0.1 b.i.d.   Rest of his meds are unchanged.  Please refer to my previous notes and  the remainder of his medication list.   PHYSICAL EXAMINATION:  VITAL SIGNS:  His blood pressure today was  187/73, his pulse 60 and regular.  His weight is 172.  HEENT:  Ruddy complexion otherwise unchanged.  NECK:  Carotids upstrokes are equal bilateral without bruits, no JVD.  Thyroid is not enlarged.  Trachea is midline.  LUNGS:  Clear to auscultation.  HEART:  Regular rate and rhythm.  No gallop.  ABDOMEN:  Soft, good bowel sounds.  No midline bruit.  EXTREMITIES:  No cyanosis, clubbing, or edema.  Pulses are intact.  NEURO:  Neuro exam is intact.   ASSESSMENT:  I looked back at his catheterization.  His left ventricular  diastolic pressure was 17, which is not too bad.  There was a time when  his central aortic pressure was 195.   PLAN:  Continue current medications.   As long as his blood pressure is running around 115m he is having no  angina, we will  see him back in September 27, 2008.  He will need carotid  Dopplers at that time.     Thomas C. Daleen Squibb, MD, [facility]  Electronically Signed    TCW/MedQ  DD: 12/31/2007  DT: 01/01/2008  Job #: [id number]   cc:   Paulina Fusi, MD

## 2010-11-02 NOTE — Assessment & Plan Note (Signed)
[facility]                            CARDIOLOGY OFFICE NOTE   OMARIAN, JAQUITH                       MRN:          [id number]  DATE:04/17/2007                            DOB:          10/30/1940    The patient returns today for management of his history of severe  refractory hypertension.   We started clonidine 0.1 mg p.o. b.i.d.  His blood pressures have been  averaging about 140/70 at home.  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   His resting heart rate usually runs around 50-52.  It feels like it has  slowed a little bit since we started the clonidine.  He denies any  presyncope or syncope.   We obtained his renal ultrasound from [facility].  This showed a patent left renal artery stent which had actually  been placed in 1998 by Nanetta Batty, M.D.  He had a less than 60% non  flow limiting lesion bilaterally.  His kidney size was 10.1 x 4.7 and  9.8 x 3.9 on the left.  These were about the sizes they were in 1998  before he had his renal stent.   I have reviewed the records from [facility], [facility],  records from Dyke Maes, M.D. and Dr. Allyson Sabal in the late 90's  before he had his stent placed.   He also tells me he had a 50% stenosis in a left internal carotid artery  at [facility] after his stroke.   I also reviewed an echo report from [facility] which showed only mild left  ventricular hypertrophy.   I have asked him to continue his Toprol XL 25 mg a day, amiloride 5 mg a  day, clonidine 0.1 mg p.o. b.i.d.  If he begins to have any syncope or  presyncope he will let me know.  Hopefully he can tolerate this low dose  of clonidine.   I will plan on seeing him back again in three months.  He needs carotid  Dopplers a year out from his stroke.     Thomas C. Daleen Squibb, MD, [facility]  Electronically Signed    TCW/MedQ  DD: 04/17/2007  DT: 04/17/2007  Job #: [id number]   cc:   Barney Drain, MD

## 2010-11-02 NOTE — Assessment & Plan Note (Signed)
[facility]                            CARDIOLOGY OFFICE NOTE   Aaron [name], Aaron [name]                       MRN:          [id number]  DATE:04/17/2007                            DOB:          1940-08-06    ADDENDUM   Send copy to Dr. Roderic Scarce C. Daleen Squibb, MD, [facility]  Electronically Signed    TCW/MedQ  DD: 04/17/2007  DT: 04/18/2007  Job #: [id number]

## 2010-11-02 NOTE — Cardiovascular Report (Signed)
NAME:  Aaron [name], Aaron [name]              ACCOUNT NO.:  [id number]   MEDICAL RECORD NO.:  [id number]          PATIENT TYPE:  OIB   LOCATION:  1967                         FACILITY:  [facility]   PHYSICIAN:  [name]. 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  He  has been asymptomatic, but recently had a radionuclide imaging study  which demonstrated a large lateral defect with redistribution, ejection  fraction was calculated at 41%.  Based on this, it was felt that cardiac  catheterization was indicated.  He was seen by Dr. Daleen Squibb and set up for  catheterization.  The patient has also had renal stenting and mild  chronic renal insufficiency.  His creatinine today was 1.5.   PROCEDURES:  1. Left heart catheterization.  2. Selective coronary arteriography.   DESCRIPTION OF PROCEDURE:  The patient was brought to the cath lab and  prepped and draped in the usual fashion.  We infused 150 mL of normal  saline.  His creatinine was borderline at 1.5.  We elected not to do  left ventriculography, but to measure left heart pressures.  During the  course of the procedure, the patient's pressure was high and he was  given 10 mg of intravenous hydralazine followed by 5 additional mg of  hydralazine.  There were no complications.  Standard Judkins catheters  were utilized to shoot the left and right coronary arteries, and less  than 50 mL of contrast was utilized.  He tolerated procedure without  complication.  He was taken to the holding area in satisfactory clinical  condition.  I then reviewed the films with his son and wife.  I also  reviewed the films with the patient in the room.   HEMODYNAMIC DATA.:  1. Central aortic pressure was 195/65, mean 110.  2. Left ventricular  pressure 196/17.  3. No gradient pullback across aortic valve.   ANGIOGRAPHIC DATA:  1. The left main is free of critical disease.  2. The LAD courses to the apex.  The LAD appears to be diffusely      diseased in its proximal portion.  There is diffuse segmental      plaquing.  This would be measured at about 50-60% luminal      reduction.  The diagonal itself is a first small diagonal and      second diagonal is moderately sized vessel with mild ostial      narrowing and about 40% area of narrowing.  The mid distal LAD has      probably 30%-40% narrowing with evidence of diffuse luminal      irregularities.  3. The circumflex provides a large marginal branch which is stented      and totally occluded in the stent.  There is slow antegrade filling      at the distal end of the stent.  There is a fairly extensive  retrograde filling of the circumflex territory as well.  The large      AV portion of the circumflex vessel has mild luminal irregularity      but without critical narrowing.  4. The right coronary artery is fairly large caliber vessel.  There is      30-40% narrowing proximal mid and 30% narrowing in the mid.  There      is a widely patent stent distally with some mild narrowing distal      to that prior to the PDA takeoff.  The PDA and posterolateral      branches are without critical disease.  There appears to be some      collateralization of the distal vessel.   CONCLUSION:  1. Abnormal Myoview in the lateral territory with occlusion of      previously placed drug-eluting stent.  2. Continued patency of the RCA stent.  3. Scattered coronary artery disease as described above.   DISPOSITION:  Procedure was accomplished with less than 50 mL of  contrast.  IV fluids will be administered.  The patient is to see Dr.  Daleen Squibb back in followup within the next week.  He will be encouraged to  have fluids and will get a basic metabolic profile at that time.      [name]. [name], MD, [facility]  Electronically Signed     TDS/MEDQ  D:  09/27/2007  T:  09/28/2007  Job:  [id number]   cc:   Thomas C. Wall, MD, [facility]

## 2010-11-02 NOTE — Assessment & Plan Note (Signed)
[facility]                            CARDIOLOGY OFFICE NOTE   Aaron, [name]                       MRN:          [id number]  DATE:07/03/2007                            DOB:          [birth year]    Aaron [name] returns today for management of severe refractory  hypertension.  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  His blood  pressure was 210/92.  When he at rest and he is unemotional,  his blood  pressure runs about 150-160.  It has been this way for years as outlined  in the chart.   He does not remember taking minoxidil except prior to his renal artery  stent.  He has had problems with elevating his clonidine in the past but  is willing to go to 0.2 b.i.d.   He also has coronary artery disease and is status post two stents in  April 2008.  He is having no angina or ischemic symptoms.  He also has  nonobstructive carotid disease which was evaluated during the time of  his stroke.  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   His blood pressure today again was very high. His face is red.  Respiratory rate is 18.  He is alert and oriented.  LUNGS:  Clear to auscultation.  HEART:  Reveals a regular rate and rhythm without gallop.  ABDOMEN:  Soft, good bowel sounds.  EXTREMITIES:  Reveal no edema.  Pulses are intact.  NEUROLOGIC:  Intact.   I had a long talk with Aaron [name] today.  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  I think he is very passionate about this and I  agree with him. I would like to get his resting blood pressure down to  150 however.   PLAN:  1. Increase clonidine to 0.2 mg p.o. b.i.d.  2. Continue his other blood pressure medicines which include      amlodipine and Diovan 10/320 mg daily, Toprol XL 25 mg a day and  amiloride 5 mg a day.  He does not tolerate diuretics well.  3. Continue his statin and aspirin and Plavix.  4. Followup with me in April 2009 unless he cannot tolerate the      clonidine.  We could always try low dose minoxidil in the future.   We will obtain an exercise rest stress Myoview in April which will be a  year out from the stents.     Thomas C. Daleen Squibb, MD, [facility]  Electronically Signed    TCW/MedQ  DD: 07/03/2007  DT: 07/04/2007  Job #: [id number]   cc:   Merrily Pew, MD

## 2010-11-02 NOTE — Assessment & Plan Note (Signed)
[facility]                            CARDIOLOGY OFFICE NOTE   Aaron [name], Aaron [name]                       MRN:          [id number]  DATE:09/17/2007                            DOB:          06-16-1941    Mr. Aaron [name] returns today for further management of the following  issues.  1. Coronary artery disease.  He is status post drug-eluting stents to      his right coronary artery and circumflex October 16, 2006.  His LAD      was free of significant disease.  He had mild global hypokinesia      with EF of 40-45%.  2. Resistant hypertension.  He recently had Tekturna 150 mg a day      added by Dr. Tomasa Blase.  His blood pressures have been better, though      they still or very labile with emotion or pastoral input.  3. Mixed hyperlipidemia.  He is due for lipids with Dr. Tomasa Blase in      September.  4. History of CVA with near full functional recovery.   He denies orthopnea, PND, peripheral edema.  He has no claudication.  He  had no symptoms of TIAs or stroke.  He has had no angina, dyspnea on  exertion.   CURRENT MEDICATIONS:  1. Amiloride 5 mg daily.  2. Toprol XL 25 mg a day.  3. Omeprazole 20 mg p.r.n.  4. Exforge 10/320 daily.  5. Plavix 75 mg a day.  6. Aspirin 325 mg a day.  7. Tricor 145 mg a day.  8. Clonidine 0.1 mg p.o. b.i.d.  9. Mucinex 400 mg a day.  10.Simvastatin 40 mg daily.  11.Tekturna 150 mg a day.   PHYSICAL EXAMINATION:  VITAL SIGNS:  Blood pressure was initially 200/78  when he arrived. I rechecked it with a large cuff on the left arm, and  it was 150/70 with me.  His weight is 168, up 7.  Respiratory rate 18.  HEENT:  Normocephalic, atraumatic.  PERRLA.  Extraocular movements  intact.  Sclerae clear.  Facial symmetry is normal.  Dentition  satisfactory.  Face is ruddy; this is baseline.  He is alert and  oriented x3.  NECK:  Supple.  Carotid upstrokes were equal bilaterally with soft bruit  on the right.  HEART:  Reveals  a nonsustained PMI.  It is not displaced.  He has a  normal S1-S2.  No gallop.  ABDOMEN:  Soft, good bowel sounds.  No midline or flank bruit was  appreciated.  No hepatomegaly or organomegaly.  EXTREMITIES:  No cyanosis, clubbing or edema.  Pulses are 2+/4+ dorsalis  pedis, 1+/4+ posterior tibial.  He has dependent rubor with decreased  capillary refill.  There is no sign of any ulcers or unhealed cuts.  There is no DVT.  SKIN:  Unremarkable.  NEUROLOGIC:  Exam is intact.   His electrocardiogram shows sinus bradycardia with LVH and strain.  There are nonspecific ST-segment changes.   ASSESSMENT/PLAN:  Aaron [name] blood pressure is the best I have seen  it.  Perhaps the Danie Chandler is  going to be a very helpful implantation by  Dr. Tomasa Blase.  I am delighted.   He is almost a year out from his drug-eluting stents to this circumflex  and his right coronary artery.  We will arrange for an adenosine stress  Myoview.  His blood pressure probably will not allow an exercise test in  our laboratory.  In addition, we will obtain carotid Dopplers.   I have made no changes to his medical program.  I have asked him to  follow blood work with Dr. Tomasa Blase.  I will plan on seeing him back in 6  months assuming the above studies are unremarkable.     Thomas C. Daleen Squibb, MD, [facility]  Electronically Signed    TCW/MedQ  DD: 09/17/2007  DT: 09/17/2007  Job #: [id number]   cc:   Barney Drain, MD, [facility]an

## 2010-11-05 ENCOUNTER — Ambulatory Visit: Payer: BC Managed Care – PPO | Admitting: Physician Assistant

## 2010-11-22 ENCOUNTER — Encounter (INDEPENDENT_AMBULATORY_CARE_PROVIDER_SITE_OTHER): Payer: Self-pay | Admitting: General Surgery

## 2010-11-24 ENCOUNTER — Ambulatory Visit: Payer: BC Managed Care – PPO | Admitting: Cardiology

## 2010-12-14 ENCOUNTER — Encounter (HOSPITAL_COMMUNITY)
Admission: RE | Admit: 2010-12-14 | Discharge: 2010-12-14 | Disposition: A | Discharge status: Home / Self Care | Payer: Medicare Other | Source: Ambulatory Visit | Attending: General Surgery | Admitting: General Surgery

## 2010-12-14 LAB — CBC
Hemoglobin: 13.5 g/dL (ref 13.0–17.0)
MCH: 25.7 pg — ABNORMAL LOW (ref 26.0–34.0)
MCHC: 33.3 g/dL (ref 30.0–36.0)
MCV: 77 fL — ABNORMAL LOW (ref 78.0–100.0)

## 2010-12-14 LAB — BASIC METABOLIC PANEL
Calcium: 9.4 mg/dL (ref 8.4–10.5)
Creatinine, Ser: 1.24 mg/dL (ref 0.50–1.35)
GFR calc non Af Amer: 58 mL/min — ABNORMAL LOW (ref >60)
Glucose, Bld: 212 mg/dL — ABNORMAL HIGH (ref 70–99)
Sodium: 141 mEq/L (ref 135–145)

## 2010-12-14 LAB — SURGICAL PCR SCREEN: Staphylococcus aureus: POSITIVE — AB

## 2010-12-17 ENCOUNTER — Inpatient Hospital Stay (HOSPITAL_COMMUNITY)
Admission: RE | Admit: 2010-12-17 | Discharge: 2010-12-22 | DRG: 345 | Disposition: A | Discharge status: Home / Self Care | Payer: Medicare Other | Source: Ambulatory Visit | Attending: General Surgery | Admitting: General Surgery

## 2010-12-17 ENCOUNTER — Other Ambulatory Visit (INDEPENDENT_AMBULATORY_CARE_PROVIDER_SITE_OTHER): Payer: Self-pay | Admitting: General Surgery

## 2010-12-17 DIAGNOSIS — Z8546 Personal history of malignant neoplasm of prostate: Secondary | ICD-10-CM

## 2010-12-17 DIAGNOSIS — Z433 Encounter for attention to colostomy: Principal | ICD-10-CM

## 2010-12-17 DIAGNOSIS — E119 Type 2 diabetes mellitus without complications: Secondary | ICD-10-CM | POA: Diagnosis present

## 2010-12-17 DIAGNOSIS — Z8673 Personal history of transient ischemic attack (TIA), and cerebral infarction without residual deficits: Secondary | ICD-10-CM

## 2010-12-17 DIAGNOSIS — K560 Paralytic ileus: Secondary | ICD-10-CM | POA: Diagnosis not present

## 2010-12-17 DIAGNOSIS — I10 Essential (primary) hypertension: Secondary | ICD-10-CM | POA: Diagnosis present

## 2010-12-17 DIAGNOSIS — R5381 Other malaise: Secondary | ICD-10-CM | POA: Diagnosis present

## 2010-12-17 LAB — GLUCOSE, CAPILLARY
Glucose-Capillary: 154 mg/dL — ABNORMAL HIGH (ref 70–99)
Glucose-Capillary: 218 mg/dL — ABNORMAL HIGH (ref 70–99)
Glucose-Capillary: 168 mg/dL — ABNORMAL HIGH (ref 70–99)

## 2010-12-18 LAB — CBC
HCT: 39.1 % (ref 39.0–52.0)
Hemoglobin: 12.7 g/dL — ABNORMAL LOW (ref 13.0–17.0)
MCH: 25.8 pg — ABNORMAL LOW (ref 26.0–34.0)
MCHC: 32.5 g/dL (ref 30.0–36.0)
MCV: 79.3 fL (ref 78.0–100.0)
RDW: 16 % — ABNORMAL HIGH (ref 11.5–15.5)

## 2010-12-18 LAB — BASIC METABOLIC PANEL
BUN: 21 mg/dL (ref 6–23)
Calcium: 8.4 mg/dL (ref 8.4–10.5)
Creatinine, Ser: 1.32 mg/dL (ref 0.50–1.35)
GFR calc Af Amer: >60 mL/min (ref >60)
GFR calc non Af Amer: 54 mL/min — ABNORMAL LOW (ref >60)
Glucose, Bld: 139 mg/dL — ABNORMAL HIGH (ref 70–99)

## 2010-12-18 LAB — GLUCOSE, CAPILLARY
Glucose-Capillary: 150 mg/dL — ABNORMAL HIGH (ref 70–99)
Glucose-Capillary: 125 mg/dL — ABNORMAL HIGH (ref 70–99)
Glucose-Capillary: 148 mg/dL — ABNORMAL HIGH (ref 70–99)

## 2010-12-18 NOTE — Op Note (Signed)
  NAMELONZIE, SIMMER NO.:  [id number]  MEDICAL RECORD NO.:  [id number]  LOCATION:  2899                         FACILITY:  [facility]  PHYSICIAN:  Heloise Purpura, MD      DATE OF BIRTH:  [birth year]  DATE OF PROCEDURE:  12/17/2010 DATE OF DISCHARGE:                              OPERATIVE REPORT   PREOPERATIVE DIAGNOSES: 1. History of prostate cancer. 2. History of bladder neck contracture.  POSTOPERATIVE DIAGNOSES: 1. History of prostate cancer. 2. History of bladder neck contracture.  PROCEDURE:  Foley catheter insertion (14-French).  SURGEON:  Heloise Purpura, MD  COMPLICATIONS:  Known.  INDICATIONS:  Aaron [name] is a 70 year old gentleman with a history of prostate cancer status post a radical retropubic prostatectomy.  He does have a history of bladder neck contracture and previously has required cystoscopic placement of a Foley catheter for this reason.  In November 2011, he required an intraoperative consult for catheter placement during his coronary artery bypass grafting procedure.  He is scheduled today to undergo reversal of a colostomy which was performed during that hospitalization in November 2011 due to a perforated viscus.  It has been requested by Dr. Janee Morn to have a catheter placed by Urology considering the difficulties with catheter placement in the past.  The potential risks, complications, alternative options have been discussed with the patient in detail and informed consent obtained.  DESCRIPTION OF PROCEDURE:  The patient was taken to the operating room, and a general anesthetic was administered.  He was given preoperative antibiotics, placed in the dorsal lithotomy position in preparation for Dr. Carollee Massed procedure.  He was administered preoperative antibiotics. A preoperative time-out was performed.  The patient's genitalia was then prepped and draped in the usual sterile fashion and an attempt was made to place a 14-French  catheter.  This was placed without difficulty and return of grossly clear urine.  It was placed to straight drainage.  At this point, the procedure was turned over to Dr. Janee Morn.     Heloise Purpura, MD     LB/MEDQ  D:  12/17/2010  T:  12/17/2010  Job:  [id number]  cc:   Gabrielle Dare. Janee Morn, M.D.  Electronically Signed by Heloise Purpura MD on 12/18/2010 10:18:38 PM

## 2010-12-19 LAB — GLUCOSE, CAPILLARY
Glucose-Capillary: 107 mg/dL — ABNORMAL HIGH (ref 70–99)
Glucose-Capillary: 130 mg/dL — ABNORMAL HIGH (ref 70–99)
Glucose-Capillary: 142 mg/dL — ABNORMAL HIGH (ref 70–99)

## 2010-12-20 DIAGNOSIS — I10 Essential (primary) hypertension: Secondary | ICD-10-CM

## 2010-12-20 LAB — GLUCOSE, CAPILLARY
Glucose-Capillary: 115 mg/dL — ABNORMAL HIGH (ref 70–99)
Glucose-Capillary: 123 mg/dL — ABNORMAL HIGH (ref 70–99)
Glucose-Capillary: 128 mg/dL — ABNORMAL HIGH (ref 70–99)
Glucose-Capillary: 131 mg/dL — ABNORMAL HIGH (ref 70–99)

## 2010-12-21 LAB — GLUCOSE, CAPILLARY
Glucose-Capillary: 117 mg/dL — ABNORMAL HIGH (ref 70–99)
Glucose-Capillary: 117 mg/dL — ABNORMAL HIGH (ref 70–99)
Glucose-Capillary: 125 mg/dL — ABNORMAL HIGH (ref 70–99)

## 2010-12-22 LAB — GLUCOSE, CAPILLARY: Glucose-Capillary: 157 mg/dL — ABNORMAL HIGH (ref 70–99)

## 2010-12-23 NOTE — Op Note (Signed)
Aaron [name], Aaron [name]              ACCOUNT NO.:  [id number]  MEDICAL RECORD NO.:  [id number]  LOCATION:  5124                         FACILITY:  [facility]  PHYSICIAN:  Gabrielle Dare. Janee Morn, M.D.DATE OF BIRTH:  1940/08/15  DATE OF PROCEDURE:  12/17/2010 DATE OF DISCHARGE:                              OPERATIVE REPORT   PREOPERATIVE DIAGNOSIS:  Colostomy, status post emergent resection for perforated diverticulitis.  POSTOPERATIVE DIAGNOSIS:  Colostomy, status post emergent resection for perforated diverticulitis.  PROCEDURE:  Colostomy takedown.  SURGEON:  Gabrielle Dare. Janee Morn, MD  ASSISTANT:  Ardeth Sportsman, MD  ANESTHESIA:  General endotracheal.  HISTORY OF PRESENT ILLNESS:  Aaron [name] is a 70 year old gentleman who underwent coronary artery bypass grafting in January 2012.  During his postoperative course from that, he suffered a ruptured diverticulitis requiring colectomy and colostomy in an emergent basis.  He presents today for elective takedown of his colostomy.  He has undergone preoperative cardiac clearance and has been doing very well.  He did tolerate his bowel prep.  PROCEDURE IN DETAIL:  Informed consent was obtained and the patient was identified in the preop holding area.  He received intravenous antibiotics.  He was brought to the operating room.  General endotracheal anesthesia was administered by the anesthesia staff.  A time-out procedure was performed.  The patient's Foley catheter was placed by Dr. Laverle Patter from Urology due to previous stricture that went without difficulty and was dictated separately by Dr. Laverle Patter.  Next, prior to prepping, his colostomy was closed with a running 2-0 silk suture.  Next, his abdomen and perineum were prepped and draped in a sterile fashion as he was placed in lithotomy position.  A midline incision was made to encompass and excise his old scar.  The skin of the scar was removed.  The subcutaneous tissues were divided out  starting in the upper midline down and exposing the fascia.  Fascia was then carefully divided along the midline and peritoneal cavity was entered gently under direct vision.  There were some small bowel adhesions up to the upper portion of the incision.  These were initially avoided and the fascia was clear further down and we opened that up.  This allowed excellent exposure to take down the small bowel adhesions without any enterotomies or other complications.  Once that was done, the remainder of the midline incision inside was clear of adhesions and the rest of the abdomen was explored.  The patient's had a good length of distal sigmoid left and on his preoperative contrast enema study did not have any significant further disease in that area.  This was already nicely mobilized and appeared completely viable.  Attention was then directed to the colostomy.  An elliptical incision was made in the skin around the colostomy.  This was carried down to the subcutaneous tissues and dissected out down to the fascia.  The colostomy was drown back into the abdomen and further freed up from the abdominal wall.  There appeared to be a small parastomal hernia sac present, but the musculature of the abdominal wall seemed like it would come back together easily for later closure.  At this point, the end of the colostomy  was trimmed back little bit, first by taking the small amount of mesentery between clamps and tying it with silk suture and then the end of possibly was divided with a GIA 75 stapler which achieved excellent closure.  The self- retaining retractor was then replaced in the abdomen and the descending colon did not have any significant diverticular disease noted.  This was laid down nicely next to the distal sigmoid.  Decision at that time was made to do a side-to-side anastomosis as there was plenty of colon on each side.  They laid nicely together without tension.  A crotch stitch of  2-0 silk was placed.  A side-to-side anastomosis was made with GI 75 stapler.  The staple line was checked on the inside and there was no significant bleeding.  The resultant enterotomy was then closed in a hand-sewn fashion with interrupted 2-0 silk sutures.  The anastomosis was healthy and an additional crotch stitch was placed for further support.  We then changed our gloves.  The anastomosis was reinspected. Completely viable and had nice closure.  It was a bit high to do the air testing with a rigid 6, so that was not done.  There were no concerns about the viability or integrity.  The abdomen was then copiously irrigated.  Irrigation fluid returned clear.  The fascia came together nicely in a vertical fashion, so running #1 PDS was used in the inside and then from the outside.  Running #1 PDS was used to close the fascia and it came together nicely on the colostomy site.  Next, the midline wound was again irrigated, we reinspected our anastomosis thoroughly and appeared viable without complications.  Bowel content was removed back to the anatomic position.  The omentum brought back down and laid down into the pelvis over the anastomosis.  Midline fascia was then closed with 2 lengths of running #1 looped PDS one from each end of the fascia and additional intermittent figure-of-eight #1 Novafil sutures were placed as internal extensions.  The PDS was tied together in the middle. Subcutaneous tissues were irrigated from both wounds and the skin was closed with staples followed by placement of intermittent Telfa wicks. Sponge, needle, and instrument counts were all correct.  The patient tolerated the procedure well without apparent complication.  He was taken to the recovery room in stable condition.     Gabrielle Dare Janee Morn, M.D.     BET/MEDQ  D:  12/17/2010  T:  12/17/2010  Job:  [id number]  cc:   Salvatore Decent. Dorris Fetch, M.D. Thomas C. Daleen Squibb, MD, [facility] Heloise Purpura,  MD  Electronically Signed by Violeta Gelinas M.D. on 12/23/2010 02:19:37 PM

## 2010-12-29 ENCOUNTER — Ambulatory Visit (INDEPENDENT_AMBULATORY_CARE_PROVIDER_SITE_OTHER): Payer: Medicare Other | Admitting: General Surgery

## 2010-12-29 DIAGNOSIS — Z9889 Other specified postprocedural states: Secondary | ICD-10-CM

## 2010-12-29 DIAGNOSIS — Z9049 Acquired absence of other specified parts of digestive tract: Secondary | ICD-10-CM

## 2010-12-29 NOTE — Progress Notes (Signed)
Subjective:     Patient ID: Aaron [name], male   DOB: Sep 09, 1940, 70 y.o.   MRN: [id number]    There were no vitals taken for this visit.    HPI Patient is status post colostomy takedown. He's been doing very well. He is not taken any pain medication since leaving the hospital. His wound has a small amount of drainage mostly from the ostomy site. He is eating and moving his bowels approximately twice a day. He occasionally takes Tylenol.  Review of Systems     Objective:   Physical Exam Abdomen is soft and nontender. Incision along the midline has a small opening above the umbilicus. A small scab was removed from there and amount of serous drainage was removed. There is no evidence of cellulitis. The old colostomy site was also opened slightly between staples. There was only some small serous drainage. There is no evidence of cellulitis.   Assessment:     Doing well after colostomy takedown    Plan:     Return next week for staple removal. Continue wound care. No lifting for 6 weeks after surgery.

## 2010-12-31 ENCOUNTER — Telehealth (INDEPENDENT_AMBULATORY_CARE_PROVIDER_SITE_OTHER): Payer: Self-pay | Admitting: General Surgery

## 2011-01-05 ENCOUNTER — Encounter (INDEPENDENT_AMBULATORY_CARE_PROVIDER_SITE_OTHER): Payer: Self-pay | Admitting: General Surgery

## 2011-01-05 ENCOUNTER — Ambulatory Visit (INDEPENDENT_AMBULATORY_CARE_PROVIDER_SITE_OTHER): Payer: Medicare Other | Admitting: General Surgery

## 2011-01-05 DIAGNOSIS — K5732 Diverticulitis of large intestine without perforation or abscess without bleeding: Secondary | ICD-10-CM

## 2011-01-05 DIAGNOSIS — K5720 Diverticulitis of large intestine with perforation and abscess without bleeding: Secondary | ICD-10-CM

## 2011-01-05 NOTE — Progress Notes (Signed)
Subjective:     Patient ID: Aaron [name], male   DOB: 06/21/1940, 70 y.o.   MRN: [id number]  HPI Patient presents status post takedown colostomy. He's been doing well. He's eating and moving his bowels. Bowel movements are twice a day. Wound has stopped draining. He is still experiencing some fatigue after prolonged activities. He is avoiding heavy lifting.  Review of Systems     Objective:   Physical Exam    Abdomen is soft and nontender. Midline incision and old stoma site are clean dry and intact. Staples were removed. Gauze dressing was applied. There is no evidence of infection. Bowel sounds are active Assessment:     Doing great status post colostomy takedown.    Plan:        Continue to avoid heavy lifting for a total of 6 weeks after surgery. He continues to walk for exercise which is excellent. I'll see him back in 2 weeks.

## 2011-01-17 ENCOUNTER — Encounter: Payer: Self-pay | Admitting: Cardiology

## 2011-01-19 ENCOUNTER — Encounter (INDEPENDENT_AMBULATORY_CARE_PROVIDER_SITE_OTHER): Payer: Medicare Other | Admitting: General Surgery

## 2011-01-24 NOTE — Discharge Summary (Signed)
  Aaron [name], Aaron [name]              ACCOUNT NO.:  [id number]  MEDICAL RECORD NO.:  [id number]  LOCATION:  5124                         FACILITY:  [facility]  PHYSICIAN:  Gabrielle Dare. Janee Morn, M.D.DATE OF BIRTH:  [birth year]  DATE OF ADMISSION:  12/17/2010 DATE OF DISCHARGE:  12/22/2010                              DISCHARGE SUMMARY   DISCHARGE DIAGNOSIS:  Status post colostomy takedown.  HISTORY OF PRESENT ILLNESS:  Aaron [name] was admitted on December 17, 2010, for reversal of colostomy.  He initially had had an emergency colectomy with colostomy for perforated diverticulitis in the postoperative period after coronary artery bypass grafting.  HOSPITAL COURSE:  The patient underwent uncomplicated colostomy takedown.  Foley catheter was placed prior to the procedure by Dr. Heloise Purpura due to previous stricture that went well. Postoperatively, he had the expected ileus.  He remained hemodynamically stable.  His incision was clean, dry and intact.  He began to pass some gas by late on postoperative day #3.  Diabetes was managed with sliding scale insulin.  Hypertension was managed on home medications which were gradually resumed as his ileus improved.  By postoperative day #4, he began clear liquids and tolerated these well.  His diet was advanced. He had 3 bowel movements and was discharged home in stable condition on December 22, 2010, on postoperative day #5.  DISCHARGE DIET:  Cardiac.  DISCHARGE ACTIVITY:  No lifting for a total of 6 weeks after surgery.  DISCHARGE MEDICATIONS: 1. Oxycodone/APAP 5/325 one to two every 4 hours as needed for pain.     The patient is also to continue his home medications including: 2. Amiloride 5 mg daily. 3. Aspirin 81 mg daily. 4. Clonidine 0.1 mg b.i.d. 5. Exforge 10/325 one daily. 6. Lasix 40 mg daily p.r.n. 7. Lansoprazole 30 mg daily. 8. Metoprolol tartrate 25 mg b.i.d. 9. Mucinex 400 mg b.i.d. 10.Multivitamin supplement. 11.Nitroglycerin  p.r.n. 12.Plavix 75 mg daily. 13.Potassium 20 mEq when he takes Lasix. 14.Simvastatin 80 mg daily. 15.Tekturna 300 mg daily. 16.TriCor 145 mg every evening.  FOLLOWUP:  With myself in 1 week.     Gabrielle Dare Janee Morn, M.D.     BET/MEDQ  D:  01/13/2011  T:  01/13/2011  Job:  [id number]  Electronically Signed by Violeta Gelinas M.D. on 01/24/2011 08:19:36 PM

## 2011-02-09 ENCOUNTER — Ambulatory Visit (INDEPENDENT_AMBULATORY_CARE_PROVIDER_SITE_OTHER): Payer: Medicare Other | Admitting: General Surgery

## 2011-02-09 ENCOUNTER — Encounter (INDEPENDENT_AMBULATORY_CARE_PROVIDER_SITE_OTHER): Payer: Self-pay | Admitting: General Surgery

## 2011-02-09 VITALS — BP 100/68 | HR 66 | Wt 182.0 lb

## 2011-02-09 DIAGNOSIS — Z9049 Acquired absence of other specified parts of digestive tract: Secondary | ICD-10-CM

## 2011-02-09 DIAGNOSIS — Z9889 Other specified postprocedural states: Secondary | ICD-10-CM

## 2011-02-09 NOTE — Progress Notes (Signed)
Subjective:     Patient ID: Aaron [name], male   DOB: [birth year], 70 y.o.   MRN: [id number]  HPI Patient presents for followup status post colostomy takedown. He's been doing well. He's eating and moving his bowels normally. His recent bout of bronchitis. The antibiotics he took for that changed, but somewhat but he is almost over that. He has been walking and doing some exercising though fatigue due to the bronchitis has limited that recently.  Review of Systems     Objective:   Physical Exam Abdomen soft and nontender midline and colostomy site incisions are well-healed. Bowel sounds are normal. There is no tenderness.    Assessment:    Doing very well status post takedown colostomy     Plan:     Gradually increase activity and exercise as tolerated. Return p.r.n. followup as previously scheduled with the patient's gastroenterologist Dr. Chales Abrahams in [place].

## 2011-02-10 ENCOUNTER — Telehealth: Payer: Self-pay | Admitting: Cardiology

## 2011-02-10 NOTE — Telephone Encounter (Signed)
I spoke with pt at length. He has been having palpitations for 3 weeks and  "seem to be more frequent now".  Pt reports blood pressure 160-170/80 with heart rate 60-65. Reviewed medications. Pt recently had colostomy take down and was seen by Dr. Janee Morn where his bp was 100/70 in the office.   Pt will come in Friday for nurse room visit of bp and ekg.  He will also bring his blood pressure machine with him. Mylo Red RN

## 2011-02-10 NOTE — Telephone Encounter (Signed)
Per pt call he is having heart palpitations and was put on metoprolol titrate 25mg  bid and he was wanting to increase it because he is still having the issue

## 2011-02-11 ENCOUNTER — Ambulatory Visit (INDEPENDENT_AMBULATORY_CARE_PROVIDER_SITE_OTHER): Payer: Medicare Other

## 2011-02-11 ENCOUNTER — Other Ambulatory Visit (INDEPENDENT_AMBULATORY_CARE_PROVIDER_SITE_OTHER): Payer: Medicare Other | Admitting: *Deleted

## 2011-02-11 VITALS — BP 142/72 | HR 70 | Ht 66.0 in | Wt 183.4 lb

## 2011-02-11 DIAGNOSIS — R002 Palpitations: Secondary | ICD-10-CM

## 2011-02-11 LAB — BASIC METABOLIC PANEL
Chloride: 106 mEq/L (ref 96–112)
Creatinine, Ser: 1.1 mg/dL (ref 0.4–1.5)
Potassium: 3.7 mEq/L (ref 3.5–5.1)
Sodium: 141 mEq/L (ref 135–145)

## 2011-02-11 LAB — TSH: TSH: 1.94 u[IU]/mL (ref 0.35–5.50)

## 2011-02-11 LAB — MAGNESIUM: Magnesium: 2.4 mg/dL (ref 1.5–2.5)

## 2011-02-11 MED ORDER — METOPROLOL TARTRATE 50 MG PO TABS: 50.0000 mg | ORAL_TABLET | Freq: Two times a day (BID) | ORAL | Status: DC

## 2011-02-11 NOTE — Patient Instructions (Signed)
Your physician recommends that you have lab work today bmp,magnesium, and tsh.  We will call you with your results.  Your physician has recommended you make the following change in your medication:  increase metoprolol  Your physician recommends that you schedule a follow-up appointment with Dr. Daleen Squibb

## 2011-02-18 ENCOUNTER — Encounter: Payer: Self-pay | Admitting: Cardiology

## 2011-02-18 ENCOUNTER — Ambulatory Visit (INDEPENDENT_AMBULATORY_CARE_PROVIDER_SITE_OTHER): Payer: Medicare Other | Admitting: Cardiology

## 2011-02-18 DIAGNOSIS — I4949 Other premature depolarization: Secondary | ICD-10-CM

## 2011-02-18 DIAGNOSIS — I4891 Unspecified atrial fibrillation: Secondary | ICD-10-CM

## 2011-02-18 DIAGNOSIS — I2510 Atherosclerotic heart disease of native coronary artery without angina pectoris: Secondary | ICD-10-CM

## 2011-02-18 DIAGNOSIS — I493 Ventricular premature depolarization: Secondary | ICD-10-CM | POA: Insufficient documentation

## 2011-02-18 DIAGNOSIS — R002 Palpitations: Secondary | ICD-10-CM

## 2011-02-18 MED ORDER — DILTIAZEM HCL ER COATED BEADS 240 MG PO CP24: 240.0000 mg | ORAL_CAPSULE | Freq: Every day | ORAL | Status: DC

## 2011-02-18 MED ORDER — POTASSIUM CHLORIDE CRYS ER 20 MEQ PO TBCR: 20.0000 meq | EXTENDED_RELEASE_TABLET | Freq: Every day | ORAL | Status: DC

## 2011-02-18 MED ORDER — FENOFIBRATE 145 MG PO TABS: 145.0000 mg | ORAL_TABLET | Freq: Every day | ORAL | Status: DC

## 2011-02-18 NOTE — Patient Instructions (Addendum)
Your physician has recommended you make the following change in your medication  stop amlodipine Start: fenofibrate, diltiazem, and potassium  Your physician has recommended that you wear a holter monitor. Holter monitors are medical devices that record the heart's electrical activity. Doctors most often use these monitors to diagnose arrhythmias. Arrhythmias are problems with the speed or rhythm of the heartbeat. The monitor is a small, portable device. You can wear one while you do your normal daily activities. This is usually used to diagnose what is causing palpitations/syncope (passing out).   Next week  February 23, 2011 .  After you have been on the Diltiazem   Your physician recommends that you schedule a follow-up appointment in: 4 weeks with Dr. Daleen Squibb  Your physician recommends that you return for lab work Wednesday Sept 5, 2012 for a bmp. This will check your potassium level.

## 2011-02-18 NOTE — Progress Notes (Signed)
HPI Aaron [name] comes in today for followup of his palpitations. Seen last week and was noted to have multiple PVCs on EKG without any other changes. His metoprolol was doubled but he felt extremely tired and it did not help his PVCs.  He's had no chest pain or angina.  Laboratory data showed a potassium of 3.7 , and normal creatinine, magnesium of 2.4, and normal TSH.  He is still very bothered by palpitations.  I repeated an EKG today is normal sinus rhythm with unifocal PVCs. P-R QRS and QTC intervals are normal. There is an RSR prime in V1 and V2 is unchanged. Past Medical History  Diagnosis Date  . Hyperlipidemia   . Bronchitis   . Heart attack   . Prostate cancer   . Stroke     Past Surgical History  Procedure Date  . Colon surgery   . Prostate surgery     Family History  Problem Relation Age of Onset  . Heart disease Mother   . Heart disease Father   . Heart disease Sister     History   Social History  . Marital Status: Married    Spouse Name: N/A    Number of Children: N/A  . Years of Education: N/A   Occupational History  . Not on file.   Social History Main Topics  . Smoking status: Former Smoker    Quit date: 02/08/1977  . Smokeless tobacco: Not on file  . Alcohol Use: No  . Drug Use: Not on file  . Sexually Active: Not on file   Other Topics Concern  . Not on file   Social History Narrative  . No narrative on file    Allergies  Allergen Reactions  . Sulfa Drugs Cross Reactors Rash    Legs only.  . Sulfonamide Derivatives Rash    Legs only.    Current Outpatient Prescriptions  Medication Sig Dispense Refill  . aMILoride (MIDAMOR) 5 MG tablet Take 5 mg by mouth daily.        Marland Kitchen aspirin EC 81 MG tablet Take 81 mg by mouth daily.        Marland Kitchen CLONIDINE HCL PO Take 0.01 mg by mouth daily.       Marland Kitchen Clopidogrel Bisulfate (PLAVIX PO) Take 75 mg by mouth daily.       . furosemide (LASIX) 40 MG tablet Take 40 mg by mouth as needed.        Marland Kitchen  guaiFENesin (MUCINEX) 600 MG 12 hr tablet Take 400 mg by mouth 2 (two) times daily.       . lansoprazole (PREVACID) 30 MG capsule Take 30 mg by mouth daily.        . metoprolol (LOPRESSOR) 50 MG tablet Take 25 mg by mouth 2 (two) times daily.        . nitroGLYCERIN (NITROSTAT) 0.4 MG SL tablet Place 0.4 mg under the tongue every 5 (five) minutes as needed.        . Potassium Chloride (KLOR-CON PO) Take by mouth as needed.          ROS Negative other than HPI.   PE General Appearance: well developed, well nourished in no acute distress, obese HEENT: symmetrical face, PERRLA, good dentition  Neck: no JVD, thyromegaly, or adenopathy, trachea midline Chest: symmetric without deformity Cardiac: PMI non-displaced, irregular rate and rhythm normal S1, S2, no gallop or murmur Lung: clear to ausculation and percussion Vascular: all pulses full without bruits  Abdominal: nondistended, nontender,  good bowel sounds, no HSM, no bruits Extremities: no cyanosis, clubbing or edema, no sign of DVT, no varicosities  Skin: normal color, no rashes Neuro: alert and oriented x 3, non-focal Pysch: normal affect Filed Vitals:   02/18/11 1027  BP: 160/74  Pulse: 72  Height: 6\' 5"  (1.956 m)  Weight: 183 lb 12.8 oz (83.371 kg)    EKG  Labs and Studies Reviewed.   Lab Results  Component Value Date   WBC 13.5* 12/18/2010   HGB 12.7* 12/18/2010   HCT 39.1 12/18/2010   MCV 79.3 12/18/2010   PLT PLATELET CLUMPS NOTED ON SMEAR, UNABLE TO ESTIMATE 12/18/2010      Chemistry      Component Value Date/Time   NA 141 02/11/2011 1159   K 3.7 02/11/2011 1159   CL 106 02/11/2011 1159   CO2 24 02/11/2011 1159   BUN 23 02/11/2011 1159   CREATININE 1.1 02/11/2011 1159      Component Value Date/Time   CALCIUM 8.8 02/11/2011 1159   ALKPHOS 53 05/17/2010 0430   AST 31 05/17/2010 0430   ALT 34 05/17/2010 0430   BILITOT 0.8 05/17/2010 0430       No results found for this basename: CHOL   No results found for  this basename: HDL   No results found for this basename: LDLCALC   No results found for this basename: TRIG   No results found for this basename: CHOLHDL   No results found for this basename: HGBA1C   Lab Results  Component Value Date   ALT 34 05/17/2010   AST 31 05/17/2010   ALKPHOS 53 05/17/2010   BILITOT 0.8 05/17/2010   Lab Results  Component Value Date   TSH 1.94 02/11/2011

## 2011-02-22 ENCOUNTER — Telehealth: Payer: Self-pay | Admitting: Cardiology

## 2011-02-22 MED ORDER — METOPROLOL TARTRATE 25 MG PO TABS: 25.0000 mg | ORAL_TABLET | Freq: Two times a day (BID) | ORAL | Status: DC

## 2011-02-22 NOTE — Telephone Encounter (Signed)
Pt is having trouble with his heart rate dropping and then he gets SOB and he wants to talk to you about what to do

## 2011-02-22 NOTE — Telephone Encounter (Signed)
Pt calls because he has felt "wiped out" and dizzy taking the cardizem cd. He states his pulse has been 31-36 while having the palpitations. I told pt counting pulse he would not be able to count the pvcs. Also states blood pressure has been elevated. He will stop the cardizem tonight. He is to have lab work 02/23/11 in the office.  I will check his blood pressure and an ekg.  He is taking the metoprolol tartrate 25 mg bid.

## 2011-02-23 ENCOUNTER — Other Ambulatory Visit (INDEPENDENT_AMBULATORY_CARE_PROVIDER_SITE_OTHER): Payer: Medicare Other | Admitting: *Deleted

## 2011-02-23 ENCOUNTER — Encounter (INDEPENDENT_AMBULATORY_CARE_PROVIDER_SITE_OTHER): Payer: Medicare Other

## 2011-02-23 DIAGNOSIS — I4949 Other premature depolarization: Secondary | ICD-10-CM

## 2011-02-23 DIAGNOSIS — R002 Palpitations: Secondary | ICD-10-CM

## 2011-02-23 LAB — BASIC METABOLIC PANEL
CO2: 22 mEq/L (ref 19–32)
Calcium: 8.7 mg/dL (ref 8.4–10.5)
Creatinine, Ser: 1.4 mg/dL (ref 0.4–1.5)

## 2011-02-23 NOTE — Telephone Encounter (Signed)
Aaron [name] comes in today for lab work and the 48 hour holter monitor.  He was still not feeling well and very concerned b/c his hr was registering 31-33 His blood pressure today, in the office, is 152/76 with hr 74. Oxygen saturation is 98%.  Pt states "I may be worrying myself too much with my occupation and this rhythm". Pt was more relieved that his hr was not actually 31.  Pt could not tolerate the Cardizem and has not taken any since Monday. EKG reviewed by DOD Dr. Jens Som pt having bigeminy pvc . Agrees with plan to remain off Cardizem & recommendation from Dr. Daleen Squibb after reviewing. Pt aware Dr. Daleen Squibb will be in the office tomorrow. I will return call after reviewed by Dr. Lonia Chimera RN

## 2011-02-25 ENCOUNTER — Telehealth: Payer: Self-pay | Admitting: *Deleted

## 2011-02-25 NOTE — Telephone Encounter (Signed)
Pt aware of lab results and will continue taking potassium Mylo Red RN

## 2011-02-25 NOTE — Telephone Encounter (Signed)
Error Debbie Excell Neyland RN  

## 2011-03-01 ENCOUNTER — Other Ambulatory Visit: Payer: Self-pay | Admitting: Cardiology

## 2011-03-01 DIAGNOSIS — I493 Ventricular premature depolarization: Secondary | ICD-10-CM

## 2011-03-04 ENCOUNTER — Telehealth: Payer: Self-pay | Admitting: *Deleted

## 2011-03-04 NOTE — Telephone Encounter (Signed)
LMTCB monitor report. Dr. Daleen Squibb would like pt to see Dr. Johney Frame regarding his frequent pvcs.  Dennis Bast is aware and dates availble to schedule appt with Dr. Johney Frame are: 9/24  At 8:30am                                              9/26  At 8:30am                                              9/27  At 1:30pm I will call pt back early next week if unable to get in touch with him today. Pt has been out of town this week with family. Mylo Red RN

## 2011-03-07 ENCOUNTER — Telehealth: Payer: Self-pay | Admitting: Cardiology

## 2011-03-07 NOTE — Telephone Encounter (Signed)
In speaking with pt today, he is currently "getting over" pneumonia.  He was seen and treated last week while on vacation at [place].  He is feeling some better now.  His  Heart rate is in the 80's according to pt with PVCs.   Appt made with Dr. Johney Frame for 03/14/11 at 8:30 am.   Mylo Red RN

## 2011-03-07 NOTE — Telephone Encounter (Signed)
Pt returning your call msh °

## 2011-03-14 ENCOUNTER — Encounter: Payer: Self-pay | Admitting: Internal Medicine

## 2011-03-14 ENCOUNTER — Ambulatory Visit: Payer: Medicare Other | Admitting: Cardiology

## 2011-03-14 ENCOUNTER — Ambulatory Visit (INDEPENDENT_AMBULATORY_CARE_PROVIDER_SITE_OTHER): Payer: Medicare Other | Admitting: Internal Medicine

## 2011-03-14 VITALS — BP 138/98 | HR 92 | Ht 66.0 in | Wt 184.0 lb

## 2011-03-14 DIAGNOSIS — I10 Essential (primary) hypertension: Secondary | ICD-10-CM

## 2011-03-14 DIAGNOSIS — I4949 Other premature depolarization: Secondary | ICD-10-CM

## 2011-03-14 DIAGNOSIS — I509 Heart failure, unspecified: Secondary | ICD-10-CM

## 2011-03-14 DIAGNOSIS — I2510 Atherosclerotic heart disease of native coronary artery without angina pectoris: Secondary | ICD-10-CM

## 2011-03-14 DIAGNOSIS — I493 Ventricular premature depolarization: Secondary | ICD-10-CM

## 2011-03-14 DIAGNOSIS — I5021 Acute systolic (congestive) heart failure: Secondary | ICD-10-CM

## 2011-03-14 LAB — BASIC METABOLIC PANEL
BUN: 32 mg/dL — ABNORMAL HIGH (ref 6–23)
CO2: 22 mEq/L (ref 19–32)
Chloride: 107 mEq/L (ref 96–112)
Creatinine, Ser: 1 mg/dL (ref 0.4–1.5)
Glucose, Bld: 162 mg/dL — ABNORMAL HIGH (ref 70–99)

## 2011-03-14 MED ORDER — SODIUM CHLORIDE 0.9 % IV SOLN
Freq: Once | INTRAVENOUS | Status: AC
  Administered 2011-03-14: 50 mL via INTRAVENOUS

## 2011-03-14 MED ORDER — FUROSEMIDE 10 MG/ML IJ SOLN
40.0000 mg | Freq: Once | INTRAMUSCULAR | Status: AC
  Administered 2011-03-14: 40 mg via INTRAVENOUS

## 2011-03-14 MED ORDER — FUROSEMIDE 40 MG PO TABS: 40.0000 mg | ORAL_TABLET | Freq: Two times a day (BID) | ORAL | Status: DC

## 2011-03-14 NOTE — Patient Instructions (Addendum)
Your physician recommends that you schedule a follow-up appointment in:2 weeks with Dr Johney Frame and Friday with [name]  Your physician has requested that you have an echocardiogram. Echocardiography is a painless test that uses sound waves to create images of your heart. It provides your doctor with information about the size and shape of your heart and how well your heart's chambers and valves are working. This procedure takes approximately one hour. There are no restrictions for this procedure.--today if possible for acute CHF  Your physician has recommended you make the following change in your medication: start Furosemide 40mg  twice daily

## 2011-03-14 NOTE — Progress Notes (Signed)
Aaron [name] is a pleasant 70 y.o. yo patient with a h/o CAD s/p CABG who presents today for EP consultation of palpitations.  He reports developing symptoms of palpitations 3-4 years ago for which he was placed on metoprolol with good success.  He presented with NSTEMI 11/11 and underwent CABG at that time.  EF by cath was 40%.  He reports that over the past few months, he has had return of palpitations.  His metoprolol was increased to 50mg  BID without relief.  He could not tolerate this higher dose due to fatigue and therefore returned to 25mg  BID.  He was placed on cardizem but could not tolerate this due to fatigue.  He had a holter monitor placed which revealed 5,918 PVCs (3%) at times in a bigeminal pattern.  He is referred for EP consultation. He reports having progressive SOB over the past month.  He has attributed this to "pneumonia" for which he has been treated with three courses of antibiotics.  He is presently taking Azithromycin.  He reports dry cough.  He also reports significant progressive orthopnea and PND.  He reports 4 pillow orthopnea and is now sleeping in the recliner (over the past week).  He reports swelling in his legs, slightly worse than baseline.  He becomes dypnseic with walking < 100 ft.  He had planned to go to the ER after his office today for further evaluation of his shortness of breath.  Today, he denies symptoms of chest pain, dizziness, presyncope, syncope, or neurologic sequela. The patient is tolerating medications without difficulties and is otherwise without complaint today.   Past Medical History  Diagnosis Date  . Hyperlipidemia   . Hypertension   . CAD (coronary artery disease)     s/p CABG 11/11  . Prostate cancer     2006, s/p radical prostatectomy at [facility]  . Stroke     2007  . Injury of sigmoid colon     perforation requiring surgery  . Renal artery stenosis     s/p prior ptca   Past Surgical History  Procedure Date  . Colon surgery    . Prostate surgery   . Coronary artery bypass graft     Current Outpatient Prescriptions  Medication Sig Dispense Refill  . aMILoride (MIDAMOR) 5 MG tablet Take 5 mg by mouth daily.        Marland Kitchen aspirin EC 81 MG tablet Take 81 mg by mouth daily.        Marland Kitchen azithromycin (ZITHROMAX) 500 MG tablet Take 500 mg by mouth as directed. For 10 days       . CLONIDINE HCL PO Take 0.01 mg by mouth daily.       Marland Kitchen Clopidogrel Bisulfate (PLAVIX PO) Take 75 mg by mouth daily.       . furosemide (LASIX) 40 MG tablet Take 40 mg by mouth as needed.        Marland Kitchen guaiFENesin (MUCINEX) 600 MG 12 hr tablet Take 400 mg by mouth 2 (two) times daily.       . lansoprazole (PREVACID) 30 MG capsule Take 30 mg by mouth daily.        . metFORMIN (GLUMETZA) 500 MG (MOD) 24 hr tablet Take 500 mg by mouth daily with breakfast.        . metoprolol (LOPRESSOR) 25 MG tablet Take 1 tablet (25 mg total) by mouth 2 (two) times daily.      . nitroGLYCERIN (NITROSTAT) 0.4 MG SL tablet Place  0.4 mg under the tongue every 5 (five) minutes as needed.        . Potassium Chloride (KLOR-CON PO) Take by mouth as needed.        . valsartan (DIOVAN) 320 MG tablet Take 320 mg by mouth daily.          Allergies  Allergen Reactions  . Sulfa Drugs Cross Reactors Rash    Legs only.  . Sulfonamide Derivatives Rash    Legs only.    History   Social History  . Marital Status: Married    Spouse Name: N/A    Number of Children: N/A  . Years of Education: N/A   Occupational History  . Not on file.   Social History Main Topics  . Smoking status: Former Smoker    Quit date: 02/08/1977  . Smokeless tobacco: Not on file  . Alcohol Use: No  . Drug Use: No  . Sexually Active: Not on file   Other Topics Concern  . Not on file   Social History Narrative   Pt lives in [place] with spouse.  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 [address], [place]    Family History  Problem Relation Age of Onset  . Heart disease Mother   . Heart disease  Father   . Heart disease Sister     ROS- All systems are reviewed and negative except as per the HPI above  Physical Exam: Filed Vitals:   03/14/11 0839  BP: 138/98  Pulse: 92  Height: 5\' 6"  (1.676 m)  Weight: 184 lb (83.462 kg)    GEN- The patient is well appearing, alert and oriented x 3 today.   Head- normocephalic, atraumatic Eyes-  Sclera clear, conjunctiva pink Ears- hearing intact Oropharynx- clear Neck- supple, JVP 11cm Lymph- no cervical lymphadenopathy Lungs- bibasilar rales 1/3 way up, normal work of breath Heart- Regular rate and rhythm, no rubs or gallops,  2/6 diastolic murmur LUSB, PMI not laterally displaced GI- soft, NT, ND, + BS Extremities- no clubbing, cyanosis, 2+ edema MS- no significant deformity or atrophy Skin- no rash or lesion Psych- euthymic mood, full affect Neuro- strength and sensation are intact  EKG today reveals sinus rhythm 92 bpm, PR 170, QRS 104, Qtc 477, left atrial enlargement, RsR' poor R wave progression,  Event monitor from 9/5-02/25/11 reviewed and reveals frequent monomorphic PVCs at times in bigeminy,  A total of 5,918 PVCs (3%) were documented, no NSVT  Assessment and Plan:

## 2011-03-15 ENCOUNTER — Ambulatory Visit (HOSPITAL_COMMUNITY): Payer: Medicare Other | Attending: Cardiology | Admitting: Radiology

## 2011-03-15 DIAGNOSIS — I059 Rheumatic mitral valve disease, unspecified: Secondary | ICD-10-CM | POA: Insufficient documentation

## 2011-03-15 DIAGNOSIS — I493 Ventricular premature depolarization: Secondary | ICD-10-CM

## 2011-03-15 DIAGNOSIS — I5021 Acute systolic (congestive) heart failure: Secondary | ICD-10-CM

## 2011-03-15 DIAGNOSIS — I252 Old myocardial infarction: Secondary | ICD-10-CM | POA: Insufficient documentation

## 2011-03-15 DIAGNOSIS — R0602 Shortness of breath: Secondary | ICD-10-CM

## 2011-03-15 DIAGNOSIS — R0609 Other forms of dyspnea: Secondary | ICD-10-CM | POA: Insufficient documentation

## 2011-03-15 DIAGNOSIS — I379 Nonrheumatic pulmonary valve disorder, unspecified: Secondary | ICD-10-CM | POA: Insufficient documentation

## 2011-03-15 DIAGNOSIS — R0989 Other specified symptoms and signs involving the circulatory and respiratory systems: Secondary | ICD-10-CM | POA: Insufficient documentation

## 2011-03-15 DIAGNOSIS — F17200 Nicotine dependence, unspecified, uncomplicated: Secondary | ICD-10-CM | POA: Insufficient documentation

## 2011-03-15 DIAGNOSIS — I2510 Atherosclerotic heart disease of native coronary artery without angina pectoris: Secondary | ICD-10-CM | POA: Insufficient documentation

## 2011-03-15 DIAGNOSIS — I509 Heart failure, unspecified: Secondary | ICD-10-CM

## 2011-03-15 DIAGNOSIS — I079 Rheumatic tricuspid valve disease, unspecified: Secondary | ICD-10-CM | POA: Insufficient documentation

## 2011-03-16 ENCOUNTER — Encounter: Payer: Self-pay | Admitting: Internal Medicine

## 2011-03-16 ENCOUNTER — Telehealth: Payer: Self-pay | Admitting: Internal Medicine

## 2011-03-16 DIAGNOSIS — I5023 Acute on chronic systolic (congestive) heart failure: Secondary | ICD-10-CM | POA: Insufficient documentation

## 2011-03-16 NOTE — Telephone Encounter (Signed)
Pt had echo yesterday am and has gotten no results.  Very upset that he has not gotten a call regarding the results.  Concerned about his diagnosis and what he should and should not be doing.

## 2011-03-16 NOTE — Assessment & Plan Note (Signed)
The patient's recent dry cough with SOB and orthopnea are almost certainly due to acute decompensated systolic CHF.  He is volume overloaded today and had planned on going to the ER after his visit with me. I will given IV lasix in the office today and initiate lasix 40mg  BID x 3 days, then reassessment by our PA [name]).  BMET today.  I will also order an echo to assess his EF.  If his EF is depressed, he will require aggressive medical therapy.  Once compensated, I would switch metoprolol to coreg and titrate as tolerated.  He is already on an ARB.  I will inform Dr Daleen Squibb of the patients visit today.  With close outpatient follow-up, hopefully we can avoid inpatient hospitalization.

## 2011-03-16 NOTE — Assessment & Plan Note (Signed)
Stable Consider stopping clonidine and titrating CHF medicines aggressively pending echo results.

## 2011-03-16 NOTE — Telephone Encounter (Signed)
Returned call to patient and gave him his Echo results   He had a CXR on Tues of last week at [facility] and was due to have CXR on 03/23/11  Will try to obtain this today  Discussed with Dr Johney Frame he would like for Dr Daleen Squibb to address this and how he wants to proceed  I let the patient know we would obtain records and have Dr Daleen Squibb review tomorrow and call him back with a plan he is very anxious but says that today is the best he has felt in weeks  The Furosemide is working to help improve his breathing  He is very appreciative of our care Also looking for the Echo done in 02/2010

## 2011-03-16 NOTE — Assessment & Plan Note (Signed)
The patient presents for EP consultation today regarding PVCs.  Today, he has rare PVCs of a LBB L superior axis.  His recent monitor reveals 5k PVCs in 48 hours.  He has not done well with beta blockers or calcium channel blockers previously.  Today, he appears to be in acute decompensated CHF.  It may be that recent worsening of CHF and increased ventricular filling have contributed to the increase in his PVC frequency.  We will obtain an echo today.  If his EF has worsened, then aggressive treatement of CHF would be our initial treatment.  I will reserve further EP consultation pending echo results and clinically treatment of CHF.

## 2011-03-16 NOTE — Assessment & Plan Note (Signed)
No ischemic symptoms today

## 2011-03-18 ENCOUNTER — Ambulatory Visit (INDEPENDENT_AMBULATORY_CARE_PROVIDER_SITE_OTHER): Payer: Medicare Other | Admitting: Physician Assistant

## 2011-03-18 ENCOUNTER — Encounter: Payer: Self-pay | Admitting: Physician Assistant

## 2011-03-18 VITALS — BP 180/86 | HR 80 | Ht 66.0 in | Wt 181.0 lb

## 2011-03-18 DIAGNOSIS — I10 Essential (primary) hypertension: Secondary | ICD-10-CM

## 2011-03-18 DIAGNOSIS — I2510 Atherosclerotic heart disease of native coronary artery without angina pectoris: Secondary | ICD-10-CM

## 2011-03-18 DIAGNOSIS — I255 Ischemic cardiomyopathy: Secondary | ICD-10-CM | POA: Insufficient documentation

## 2011-03-18 DIAGNOSIS — I2589 Other forms of chronic ischemic heart disease: Secondary | ICD-10-CM

## 2011-03-18 DIAGNOSIS — I5023 Acute on chronic systolic (congestive) heart failure: Secondary | ICD-10-CM

## 2011-03-18 DIAGNOSIS — R002 Palpitations: Secondary | ICD-10-CM

## 2011-03-18 LAB — BASIC METABOLIC PANEL
BUN: 28 mg/dL — ABNORMAL HIGH (ref 6–23)
GFR: 50.88 mL/min — ABNORMAL LOW (ref >60.00)
Glucose, Bld: 117 mg/dL — ABNORMAL HIGH (ref 70–99)
Potassium: 3.9 mEq/L (ref 3.5–5.1)

## 2011-03-18 MED ORDER — FUROSEMIDE 40 MG PO TABS: ORAL_TABLET | ORAL | Status: DC

## 2011-03-18 MED ORDER — POTASSIUM CHLORIDE CRYS ER 20 MEQ PO TBCR: EXTENDED_RELEASE_TABLET | ORAL | Status: DC

## 2011-03-18 MED ORDER — CARVEDILOL 6.25 MG PO TABS: 6.2500 mg | ORAL_TABLET | Freq: Two times a day (BID) | ORAL | Status: DC

## 2011-03-18 NOTE — Assessment & Plan Note (Signed)
Follow up with Dr. Johney Frame as noted.

## 2011-03-18 NOTE — Assessment & Plan Note (Signed)
Uncontrolled.  Increase diuresis as noted and change beta blocker to coreg.

## 2011-03-18 NOTE — Assessment & Plan Note (Signed)
Medicine changes as noted.  Will need to discuss with Dr. Daleen Squibb.  ? If we should consider cath as it appears his EF has gone from 40% to 25% since bypass.

## 2011-03-18 NOTE — Assessment & Plan Note (Signed)
Continue ASA and Plavix.  No anginal symptoms.

## 2011-03-18 NOTE — Assessment & Plan Note (Signed)
He has had some improvement since 9/26.  Weight is down 3 pounds and he is breathing some better.  However, I think he still has a way to go.  I suspect his CXR may have reflected some CHF but was suspicious for pneumonia as well.  We will request a copy of his CXR from [place].  He has another CXR next week and will request that as well.  We discussed limiting salt.  I will increase his Lasix to 80 mg in the AM and 40 mg in the PM.  Increase K+ to 40 mEq QD.  He will do this for 3 days, then resume Lasix 40 mg BID and K+ 20 mEq QD.  Check a bmet and bnp today.  Plan early follow up early next week with me or Dr. Daleen Squibb.  Stop metoprolol.  Start coreg 6.25 mg BID.  Continue Diovan.  May consider changing amiloride to spironolactone at some point as well.  If BPs remain up, will also consider hydralazine and nitrates.

## 2011-03-18 NOTE — Patient Instructions (Signed)
Your physician recommends that you schedule a follow-up appointment in: 04/01/11 @ 12:00 TO SEE SCOTT WEAVER, PA-C  Your physician recommends that you return for lab work in: TODAY BMET/BNP 428.23, 401.1 HEART FAILURE AND HTN   Your physician has recommended you make the following change in your medication: INCREASE LASIX 80 MG IN THE AM AND 40 MG IN THE AFTERNOON FOR 3 DAYS ONLY THEN RESUME BACK TO 40 MG TWICE DAILY;  INCREASE POTASSIUM 40 MEQ ONCE DAILY FOR 3 DAYS ONLY THEN RESUME TO 20 MEQ ONCE DAILY.  STOP METOPROLOL AND START COREG 6.25 MG 1 TABLET TWICE DAILY

## 2011-03-18 NOTE — Progress Notes (Signed)
History of Present Illness: Primary Cardiologist:  Dr. Valera Castle   Aaron [name] is a 70 y.o. male who returns for follow up on CHF.    He has a history of CAD with prior stenting to the CFX and RCA.  He presented to [facility] in 11/11 with NSTEMI and underwent cath: EF 40%, pLAD 30%, 90%, 30-40%, CFX stent occluded, pRCA 30%, dRCA stent ok, pPDA 40%, 30%.  He underwent CABG: L-LAD, S-OM, S-PDA.  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  Other hx includes: carotid stenosis (bilat 40-59% in 8/11), renal artery stenosis s/p prior stenting, prior stroke, HTN, HLP, prostate CA s/p prostatectomy.  He was evaluated by Dr. Johney Frame earlier this week for symptomatic PVCs and was noted to be in acute CHF.  Weight was 184 lbs.  He was given IV Lasix in the office and Lasix 40 mg po BID was initiated.  Labs: K 3.9, creatinine 1.0.  Echo was done and demonstrates worsening LVF:  Mild LVH, EF 25%, restrictive diast dysfxn with elevated filling pressures, mild AI, mild enlarged Ao root 36 mm, mild MR, mod LAE, mild RVE, mod reduced RVSF, mild to mod PR, PASP 62-66 (mod pulmonary HTN), mod pleural effusion.  Of note, chart indicates h/o CXR at [facility] last week and repeat due next week.  The CXR was being requested.    He has lost 3 pounds since being seen on 9/26.  He is diuresing.  He feels a little better.  His breathing is less short.  He is sleeping on 2 pillows now.  He denies any further PND.  He denies syncope.  He denies chest pain.  He continues to cough.  He denies fevers or chills.  He does note a recent diagnosis of pneumonia.  [place]  A chest x-ray done at [facility] last week continued to demonstrate pneumonia.  His antibiotics have been extended period he did have hemoptysis at one point.  His hemoptysis seems to have cleared.  I  suspect the effusion noted on echo was called pneumonia vs concomitant pneumonia and CHF.    Past Medical History  Diagnosis Date  . Hyperlipidemia   . Hypertension   . CAD (coronary artery disease)     NSTEMI 11/11 -  cath: EF 40%, pLAD 30%, 90%, 30-40%, CFX stent occluded, pRCA 30%, dRCA stent ok, pPDA 40%, 30%;  s/p CABG 11/11  . Prostate cancer     2006, s/p radical prostatectomy at [facility]  . Stroke     2007  . Injury of sigmoid colon     perforation requiring hemicolectomy and colostomy with subsequent takedown  . Renal artery stenosis     s/p prior ptca  . Chronic systolic heart failure     echo 9/12: Mild LVH, EF 25%, restrictive diast dysfxn with elevated filling pressures, mild AI, mild enlarged Ao root 36 mm, mild MR, mod LAE, mild RVE, mod reduced RVSF, mild to mod PR, PASP 62-66 (mod pulmonary HTN), mod pleural effusion  . Ischemic cardiomyopathy   . Carotid stenosis     bilat 40-59% in 8/11    Current Outpatient Prescriptions  Medication Sig Dispense Refill  . aMILoride (MIDAMOR) 5 MG tablet Take 5 mg by mouth daily.        Marland Kitchen aspirin EC 81 MG tablet Take 81 mg by mouth  daily.        . azithromycin (ZITHROMAX) 500 MG tablet Take 500 mg by mouth as directed. For 10 days       . CLONIDINE HCL PO Take 0.01 mg by mouth daily.       Marland Kitchen Clopidogrel Bisulfate (PLAVIX PO) Take 75 mg by mouth daily.       . furosemide (LASIX) 40 MG tablet TAKE 80 MG IN THE MORNING AND TAKE 40 MG IN THE AFTERNOON DO THIS FOR 3 DAYS ONLY THEN RESUME BACK TO 40 MG TWICE DAILY  60 tablet  11  . guaiFENesin (MUCINEX) 600 MG 12 hr tablet Take 400 mg by mouth 2 (two) times daily.       . lansoprazole (PREVACID) 30 MG capsule Take 30 mg by mouth daily.        . metFORMIN (GLUMETZA) 500 MG (MOD) 24 hr tablet Take 500 mg by mouth daily with breakfast.        . nitroGLYCERIN (NITROSTAT) 0.4 MG SL tablet Place 0.4 mg under the tongue every 5 (five) minutes as needed.        . valsartan (DIOVAN) 320 MG  tablet Take 320 mg by mouth daily.        Marland Kitchen DISCONTD: furosemide (LASIX) 40 MG tablet Take 40 mg by mouth 2 (two) times daily.       . carvedilol (COREG) 6.25 MG tablet Take 1 tablet (6.25 mg total) by mouth 2 (two) times daily with a meal.  60 tablet  11  . potassium chloride SA (KLOR-CON M20) 20 MEQ tablet TAKE 2 TABLETS ONCE DAILY FOR 3 DAYS ONLY WITH THE INCREASED DOSE OF LASIX FOR 3 DAYS; THEN RESUME BACK TO 20 MEQ ONCE DAILY  60 tablet  11  . DISCONTD: furosemide (LASIX) 40 MG tablet Take 1 tablet (40 mg total) by mouth 2 (two) times daily.  60 tablet  11    Allergies: Allergies  Allergen Reactions  . Sulfa Drugs Cross Reactors Rash    Legs only.  . Sulfonamide Derivatives Rash    Legs only.    Social Hx:  Ex-smoker  ROS:  Please see the history of present illness.  He notes some loose stools.  All other systems reviewed and negative.   Vital Signs: BP 180/86  Pulse 80  Ht 5\' 6"  (1.676 m)  Wt 181 lb (82.101 kg)  BMI 29.21 kg/m2  PHYSICAL EXAM: Well nourished, well developed, in no acute distress HEENT: normal Neck: JVP ~ 6-7 cm Cardiac:  normal S1, S2; RRR with premature beats; diastolic murmur; no S3 Lungs:  Decreased breath sounds at the bases bilaterally with faint rales; egophony noted at the bases with dullness to percussion, no wheezing Abd: soft, nontender, no hepatomegaly Ext: 2+ pitting edema on the right; trace edema on left (patient notes right leg always more swollen) Skin: warm and dry Neuro:  CNs 2-12 intact, no focal abnormalities noted Psych: normal affect  EKG:  NSR, HR 80, LAD, incomplete RBBB, TW inversions 1, aVL, prolonged QT (QTc 484 ms), no significant changes  ASSESSMENT AND PLAN:

## 2011-03-22 ENCOUNTER — Encounter: Payer: Self-pay | Admitting: Physician Assistant

## 2011-03-22 ENCOUNTER — Ambulatory Visit (INDEPENDENT_AMBULATORY_CARE_PROVIDER_SITE_OTHER): Payer: Medicare Other | Admitting: Physician Assistant

## 2011-03-22 VITALS — BP 190/78 | HR 70 | Ht 66.0 in | Wt 179.4 lb

## 2011-03-22 DIAGNOSIS — I255 Ischemic cardiomyopathy: Secondary | ICD-10-CM

## 2011-03-22 DIAGNOSIS — I493 Ventricular premature depolarization: Secondary | ICD-10-CM

## 2011-03-22 DIAGNOSIS — I2510 Atherosclerotic heart disease of native coronary artery without angina pectoris: Secondary | ICD-10-CM

## 2011-03-22 DIAGNOSIS — I10 Essential (primary) hypertension: Secondary | ICD-10-CM

## 2011-03-22 DIAGNOSIS — I2589 Other forms of chronic ischemic heart disease: Secondary | ICD-10-CM

## 2011-03-22 DIAGNOSIS — I4949 Other premature depolarization: Secondary | ICD-10-CM

## 2011-03-22 DIAGNOSIS — I5023 Acute on chronic systolic (congestive) heart failure: Secondary | ICD-10-CM

## 2011-03-22 LAB — BASIC METABOLIC PANEL
CO2: 23 mEq/L (ref 19–32)
Calcium: 8.4 mg/dL (ref 8.4–10.5)
Creatinine, Ser: 1.4 mg/dL (ref 0.4–1.5)
GFR: 52.11 mL/min — ABNORMAL LOW (ref >60.00)
Glucose, Bld: 119 mg/dL — ABNORMAL HIGH (ref 70–99)
Sodium: 141 mEq/L (ref 135–145)

## 2011-03-22 MED ORDER — CARVEDILOL 12.5 MG PO TABS: 12.5000 mg | ORAL_TABLET | Freq: Two times a day (BID) | ORAL | Status: DC

## 2011-03-22 MED ORDER — SPIRONOLACTONE 25 MG PO TABS: 25.0000 mg | ORAL_TABLET | Freq: Every day | ORAL | Status: DC

## 2011-03-22 NOTE — Patient Instructions (Signed)
Your physician recommends that you schedule a follow-up appointment in: 3-4 WEEK WITH DR. WALL OR SCOTT WEAVER, SAME DAY DR. WALL IS IN THE OFFICE.  Your physician has requested that you have a lexiscan myoview DX 414.01 CAD, 401.1 HTN For further information please visit [email or web address] Please follow instruction sheet, as given.  Your physician recommends that you return for lab work in: BMET TODAY 414.01 CAD 401.1 HTN  Your physician recommends that you return for lab work in: 04/01/11 REPEAT BMET 401.1 HTN  Your physician has recommended you make the following change in your medication: STOP AMILORIDE; START SPIRONOLACTONE 25 MG DAILY; INCREASE COREG 12.5 MG TWICE DAILY .

## 2011-03-22 NOTE — Assessment & Plan Note (Signed)
Notes indicate he had 3% PVCs.  Doubt this has contributed to his reduced EF.  He follows up with Dr. Johney Frame next week.

## 2011-03-22 NOTE — Assessment & Plan Note (Signed)
With reduced EF, he may now be a candidate for an ICD.  Titrate meds.  He sees Dr. Johney Frame next week.  Suspect he will need adequate medication titration and follow up on his EF before embarking on AICD implantation.  Also, myoview pending as noted.

## 2011-03-22 NOTE — Assessment & Plan Note (Signed)
As noted, suspect reduced EF from HTN.  He had CABG less than one year ago.  After discussing with Dr. Daleen Squibb, will set him up for Lexiscan Myoview to make sure grafts are stable.

## 2011-03-22 NOTE — Progress Notes (Signed)
History of Present Illness: Primary Cardiologist:  Dr. Valera Castle   Aaron [name] is a 70 y.o. male who returns for follow up on CHF.    He has a history of CAD with prior stenting to the CFX and RCA.  He presented to [facility] in 11/11 with NSTEMI and underwent cath: EF 40%, pLAD 30%, 90%, 30-40%, CFX stent occluded, pRCA 30%, dRCA stent ok, pPDA 40%, 30%.  He underwent CABG: L-LAD, S-OM, S-PDA.  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  Other hx includes: carotid stenosis (bilat 40-59% in 8/11), renal insufficiency, renal artery stenosis s/p prior stenting, prior stroke, HTN, HLP, prostate CA s/p prostatectomy.  He was evaluated by Dr. Johney Frame recently for symptomatic PVCs and was noted to be in acute CHF.  Follow up echo:  Mild LVH, EF 25%, restrictive diast dysfxn with elevated filling pressures, mild AI, mild enlarged Ao root 36 mm, mild MR, mod LAE, mild RVE, mod reduced RVSF, mild to mod PR, PASP 62-66 (mod pulmonary HTN), mod pleural effusion.  Lasix was adjusted.  I saw him last week.  He was still overloaded.  I had him take 80 mg in the AM and 40 mg in the PM for 3 days.  I switched his metoprolol to coreg due to extreme BP.  This is apparently a chronic problem.     He returns for follow up on CHF.  Weight is down another 2 pounds (5 pounds down since seen by Dr. Johney Frame).  He feels better.  Dyspnea is improved. Describes NYHA class 2-2b symptoms.  No chest pain.  No syncope.  Still has PVCs.  Sleeping on 2 pillows now.  No PND.  Edema somewhat better.  Right leg is always larger.  Tolerating coreg ok.  Felt some leg cramps this am.  I did get CXR results from [place] done 9/18: "patchy infiltrate/pneumonia RUL perihilar, trace ATX or infiltrate RLL infrahilar, small bilat. Pleural effusion."  Past Medical History  Diagnosis Date  . Hyperlipidemia   . Hypertension   . CAD  (coronary artery disease)     NSTEMI 11/11 -  cath: EF 40%, pLAD 30%, 90%, 30-40%, CFX stent occluded, pRCA 30%, dRCA stent ok, pPDA 40%, 30%;  s/p CABG 11/11  . Prostate cancer     2006, s/p radical prostatectomy at [facility]  . Stroke     2007  . Injury of sigmoid colon     perforation requiring hemicolectomy and colostomy with subsequent takedown  . Renal artery stenosis     s/p prior ptca  . Chronic systolic heart failure     echo 9/12: Mild LVH, EF 25%, restrictive diast dysfxn with elevated filling pressures, mild AI, mild enlarged Ao root 36 mm, mild MR, mod LAE, mild RVE, mod reduced RVSF, mild to mod PR, PASP 62-66 (mod pulmonary HTN), mod pleural effusion  . Ischemic cardiomyopathy   . Carotid stenosis     bilat 40-59% in 8/11    Current Outpatient Prescriptions  Medication Sig Dispense Refill  . aMILoride (MIDAMOR) 5 MG tablet Take 5 mg by mouth daily.        Marland Kitchen aspirin EC 81 MG tablet Take 81 mg by mouth daily.        . carvedilol (COREG) 6.25 MG tablet Take 1 tablet (6.25 mg total) by mouth 2 (two) times daily with a meal.  60 tablet  11  .  CLONIDINE HCL PO Take 0.01 mg by mouth daily.       Marland Kitchen Clopidogrel Bisulfate (PLAVIX PO) Take 75 mg by mouth daily.       . furosemide (LASIX) 40 MG tablet TAKE 80 MG IN THE MORNING AND TAKE 40 MG IN THE AFTERNOON DO THIS FOR 3 DAYS ONLY THEN RESUME BACK TO 40 MG TWICE DAILY  60 tablet  11  . guaiFENesin (MUCINEX) 600 MG 12 hr tablet Take 400 mg by mouth 2 (two) times daily.       . lansoprazole (PREVACID) 30 MG capsule Take 30 mg by mouth daily.        . metFORMIN (GLUMETZA) 500 MG (MOD) 24 hr tablet Take 500 mg by mouth daily with breakfast.        . nitroGLYCERIN (NITROSTAT) 0.4 MG SL tablet Place 0.4 mg under the tongue every 5 (five) minutes as needed.        . potassium chloride SA (KLOR-CON M20) 20 MEQ tablet TAKE 2 TABLETS ONCE DAILY FOR 3 DAYS ONLY WITH THE INCREASED DOSE OF LASIX FOR 3 DAYS; THEN RESUME BACK TO 20 MEQ ONCE  DAILY  60 tablet  11  . valsartan (DIOVAN) 320 MG tablet Take 320 mg by mouth daily.          Allergies: Allergies  Allergen Reactions  . Sulfa Drugs Cross Reactors Rash    Legs only.  . Sulfonamide Derivatives Rash    Legs only.    Social Hx:  Ex-smoker.  Works as a Education officer, environmental.  Vital Signs: BP 190/78  Pulse 70  Ht 5\' 6"  (1.676 m)  Wt 179 lb 6.4 oz (81.375 kg)  BMI 28.96 kg/m2  PHYSICAL EXAM: Well nourished, well developed, in no acute distress HEENT: normal Neck: JVP ~ 5 cm Cardiac:  normal S1, S2; RRR with premature beats; diastolic murmur; no S3 Lungs:  Decreased breath sounds, no wheezing or rales Abd: soft, nontender, no hepatomegaly Ext: 1-2+ pitting edema on the right; trace-1+ edema on left (patient notes right leg always more swollen) Skin: warm and dry Neuro:  CNs 2-12 intact, no focal abnormalities noted Psych: normal affect  EKG:  NSR, HR 70, LAD, incomplete RBBB, TW inversions 1, aVL, prolonged QT (QTc 481 ms), no significant changes  ASSESSMENT AND PLAN:

## 2011-03-22 NOTE — Assessment & Plan Note (Addendum)
Adjust medicines as noted with follow up labs.  If creatinine goes up or his BP remains difficult to control, repeat renal artery dopplers.  Last dopplers I can find were in 01/2010 (40-59% bilat).

## 2011-03-22 NOTE — Assessment & Plan Note (Signed)
Improved.  Discussed with Dr. Daleen Squibb (primary cardiologist) who also saw patient.  Overall, suspect his reduced EF from uncontrolled HTN which has been a significant problem in the past.  Continue current Lasix and K+ dose.  Repeat BMET today to recheck renal fxn and K+.  Increase coreg to 12.5 mg BID.  Stop Amiloride and start Spironolactone 25 mg QD.  Repeat BMET next week to keep close eye on K+ and creatinine.  He sees Dr. Johney Frame next week.  Will schedule him to see me again in 3-4 weeks.

## 2011-03-24 ENCOUNTER — Telehealth: Payer: Self-pay | Admitting: Cardiology

## 2011-03-24 NOTE — Telephone Encounter (Signed)
Pt calls to make sure it is okay to have the CT scan of his chest Monday and the Lexiscan here Tuesday. Reassured pt okay to do both test and that there was no dye involved with the Lexiscan. Pt will have copies of CT and yesterday's cxr at [facility] faxed to our office. Mylo Red RN

## 2011-03-24 NOTE — Telephone Encounter (Signed)
Pt wants to talk to someone about stress test and congestive heart failure . He says his primary care Dr said he has persistant pneumonia and is scheduled at [facility] for a CT. He is confused please call

## 2011-03-28 ENCOUNTER — Other Ambulatory Visit: Payer: Self-pay | Admitting: Cardiology

## 2011-03-29 ENCOUNTER — Other Ambulatory Visit: Payer: Medicare Other | Admitting: *Deleted

## 2011-03-29 ENCOUNTER — Ambulatory Visit (HOSPITAL_BASED_OUTPATIENT_CLINIC_OR_DEPARTMENT_OTHER): Payer: Medicare Other | Admitting: Radiology

## 2011-03-29 DIAGNOSIS — I25810 Atherosclerosis of coronary artery bypass graft(s) without angina pectoris: Secondary | ICD-10-CM

## 2011-03-29 DIAGNOSIS — I4891 Unspecified atrial fibrillation: Secondary | ICD-10-CM

## 2011-03-29 DIAGNOSIS — I10 Essential (primary) hypertension: Secondary | ICD-10-CM | POA: Insufficient documentation

## 2011-03-29 DIAGNOSIS — R0609 Other forms of dyspnea: Secondary | ICD-10-CM

## 2011-03-29 DIAGNOSIS — I2510 Atherosclerotic heart disease of native coronary artery without angina pectoris: Secondary | ICD-10-CM | POA: Insufficient documentation

## 2011-03-29 MED ORDER — CARVEDILOL 12.5 MG PO TABS
12.5000 mg | ORAL_TABLET | Freq: Once | ORAL | Status: AC
  Administered 2011-03-29: 12.5 mg via ORAL

## 2011-03-29 MED ORDER — TECHNETIUM TC 99M TETROFOSMIN IV KIT
11.0000 | PACK | Freq: Once | INTRAVENOUS | Status: AC | PRN
  Administered 2011-03-29: 11 via INTRAVENOUS

## 2011-03-29 MED ORDER — REGADENOSON 0.4 MG/5ML IV SOLN
0.4000 mg | Freq: Once | INTRAVENOUS | Status: AC
  Administered 2011-03-29: 0.4 mg via INTRAVENOUS

## 2011-03-29 MED ORDER — TECHNETIUM TC 99M TETROFOSMIN IV KIT
33.0000 | PACK | Freq: Once | INTRAVENOUS | Status: AC | PRN
  Administered 2011-03-29: 33 via INTRAVENOUS

## 2011-03-29 NOTE — Progress Notes (Signed)
[facility] SITE 3 NUCLEAR MED [address] [place] [address] [phone]  Cardiology Nuclear Med Study  Aaron [name] is a 70 y.o. male [id number] [birth year]   Nuclear Med Background Indication for Stress Test:  Evaluation for Ischemia, Graft/Stent Patency History:  '09 Stents-CFX/RCA, EF=40%; '11 CABG; MPS:was done here at [facility], waiting on chart from warehouse; H/O atrial fibrillation, renal artery stenosis Cardiac Risk Factors: Carotid Disease, CVA, Family History - CAD, History of Smoking, Hypertension, Lipids, NIDDM and PVD  Symptoms:  DOE, Fatigue, Palpitations, Rapid HR and SOB   Nuclear Pre-Procedure Caffeine/Decaff Intake:  None NPO After: 6:30am   Lungs:  Clear.  O2 SAT 98% on RA. IV 0.9% NS with Angio Cath:  22g  IV Site: R Wrist  IV Started by:  Bonnita Levan, RN  Chest Size (in):  42 Cup Size: n/a  Height: 5\' 6"  (1.676 m)  Weight:  182 lb (82.555 kg)  BMI:  Body mass index is 29.38 kg/(m^2). Tech Comments:  Coreg held this a.m.    Nuclear Med Study 1 or 2 day study: 1 day  Stress Test Type:  [name]  Reading MD: Arvilla Meres, MD  Order Authorizing Provider:  Dr. Karie Schwalbe. [name]  Resting Radionuclide: Technetium 108m Tetrofosmin  Resting Radionuclide Dose: 11.0 mCi   Stress Radionuclide:  Technetium 87m Tetrofosmin  Stress Radionuclide Dose: 33.0 mCi           Stress Protocol Rest HR: 74 Stress HR: 78  Rest BP: 210/96 Stress BP: 220/94  Exercise Time (min): n/a METS: n/a   Predicted Max HR: 151 bpm % Max HR: 51.66 bpm Rate Pressure Product: 86578   Dose of Adenosine (mg):  n/a Dose of Lexiscan: 0.4 mg  Dose of Atropine (mg): n/a Dose of Dobutamine: n/a mcg/kg/min (at max HR)  Stress Test Technologist: Smiley Houseman, CMA-N  Nuclear Technologist:  Domenic Polite, CNMT     Rest Procedure:  Myocardial perfusion imaging was performed at rest 45 minutes following the intravenous administration of Technetium 80m  Tetrofosmin.  Rest ECG: Nonspecific T-wave changes with ventricular bigeminy.  Stress Procedure:  The patient received IV Lexiscan 0.4 mg over 15-seconds.  Technetium 30m Tetrofosmin injected at 30-seconds.  There were no significant changes with Lexiscan, just frequent PVC's.  Patient has a baseline hypertension, 210/96; which continued to remain elevated with infusion.  Patient was given Coreg 12.5 mg p.o. after infusion.  Quantitative spect images were obtained after a 45 minute delay.   BP was rechecked after stress images and was down to 148/87.   Stress ECG: No significant ST segment change suggestive of ischemia.  QPS Raw Data Images:  Acquisition technically good; evidence of LVE. Stress Images:  There is decreased uptake in the inferior wall and lateral wall. Rest Images:  There is decreased uptake in the inferior wall and lateral wall. Subtraction (SDS):  No evidence of ischemia. Transient Ischemic Dilatation (Normal <1.22):  1.06 Lung/Heart Ratio (Normal <0.45):  0.36  Quantitative Gated Spect Images QGS EDV:  n/a QGS ESV:  n/a QGS cine images:  Study not gated; LVE. QGS EF: Study not gated  Impression Exercise Capacity:  Lexiscan with no exercise. BP Response:  Hypertensive blood pressure response. Clinical Symptoms:  No chest pain. ECG Impression:  No significant ST segment change suggestive of ischemia; patient with ventricular bigeminy during the study. Comparison with Prior Nuclear Study: No images to compare  Overall Impression:  Abnormal stress nuclear study with small fixed inferoapical  and moderate anterolateral defects suggestive of previous infarcts; no ischemia; study not gated; evidence of LVE.    Olga Millers

## 2011-03-30 ENCOUNTER — Encounter: Payer: Self-pay | Admitting: Critical Care Medicine

## 2011-03-30 ENCOUNTER — Emergency Department (HOSPITAL_COMMUNITY): Payer: Medicare Other

## 2011-03-30 ENCOUNTER — Telehealth: Payer: Self-pay | Admitting: Critical Care Medicine

## 2011-03-30 ENCOUNTER — Inpatient Hospital Stay (HOSPITAL_COMMUNITY)
Admission: EM | Admit: 2011-03-30 | Discharge: 2011-04-03 | DRG: 280 | Disposition: A | Discharge status: Home / Self Care | Payer: Medicare Other | Source: Ambulatory Visit | Attending: Internal Medicine | Admitting: Internal Medicine

## 2011-03-30 DIAGNOSIS — I2510 Atherosclerotic heart disease of native coronary artery without angina pectoris: Secondary | ICD-10-CM | POA: Diagnosis present

## 2011-03-30 DIAGNOSIS — I252 Old myocardial infarction: Secondary | ICD-10-CM

## 2011-03-30 DIAGNOSIS — I5023 Acute on chronic systolic (congestive) heart failure: Secondary | ICD-10-CM | POA: Diagnosis present

## 2011-03-30 DIAGNOSIS — Z9861 Coronary angioplasty status: Secondary | ICD-10-CM

## 2011-03-30 DIAGNOSIS — Z7902 Long term (current) use of antithrombotics/antiplatelets: Secondary | ICD-10-CM

## 2011-03-30 DIAGNOSIS — Z87442 Personal history of urinary calculi: Secondary | ICD-10-CM

## 2011-03-30 DIAGNOSIS — I509 Heart failure, unspecified: Secondary | ICD-10-CM | POA: Diagnosis present

## 2011-03-30 DIAGNOSIS — Z7982 Long term (current) use of aspirin: Secondary | ICD-10-CM

## 2011-03-30 DIAGNOSIS — Z951 Presence of aortocoronary bypass graft: Secondary | ICD-10-CM

## 2011-03-30 DIAGNOSIS — J449 Chronic obstructive pulmonary disease, unspecified: Secondary | ICD-10-CM | POA: Diagnosis present

## 2011-03-30 DIAGNOSIS — I517 Cardiomegaly: Secondary | ICD-10-CM | POA: Diagnosis present

## 2011-03-30 DIAGNOSIS — Z8673 Personal history of transient ischemic attack (TIA), and cerebral infarction without residual deficits: Secondary | ICD-10-CM

## 2011-03-30 DIAGNOSIS — I214 Non-ST elevation (NSTEMI) myocardial infarction: Secondary | ICD-10-CM

## 2011-03-30 DIAGNOSIS — Z79899 Other long term (current) drug therapy: Secondary | ICD-10-CM

## 2011-03-30 DIAGNOSIS — I658 Occlusion and stenosis of other precerebral arteries: Secondary | ICD-10-CM | POA: Diagnosis present

## 2011-03-30 DIAGNOSIS — Z8546 Personal history of malignant neoplasm of prostate: Secondary | ICD-10-CM

## 2011-03-30 DIAGNOSIS — J189 Pneumonia, unspecified organism: Secondary | ICD-10-CM | POA: Diagnosis present

## 2011-03-30 DIAGNOSIS — I2589 Other forms of chronic ischemic heart disease: Secondary | ICD-10-CM | POA: Diagnosis present

## 2011-03-30 DIAGNOSIS — I739 Peripheral vascular disease, unspecified: Secondary | ICD-10-CM | POA: Diagnosis present

## 2011-03-30 DIAGNOSIS — Z87891 Personal history of nicotine dependence: Secondary | ICD-10-CM

## 2011-03-30 DIAGNOSIS — M7989 Other specified soft tissue disorders: Secondary | ICD-10-CM

## 2011-03-30 DIAGNOSIS — E785 Hyperlipidemia, unspecified: Secondary | ICD-10-CM | POA: Diagnosis present

## 2011-03-30 DIAGNOSIS — I6529 Occlusion and stenosis of unspecified carotid artery: Secondary | ICD-10-CM | POA: Diagnosis present

## 2011-03-30 DIAGNOSIS — Z23 Encounter for immunization: Secondary | ICD-10-CM

## 2011-03-30 DIAGNOSIS — J4489 Other specified chronic obstructive pulmonary disease: Secondary | ICD-10-CM | POA: Diagnosis present

## 2011-03-30 DIAGNOSIS — N289 Disorder of kidney and ureter, unspecified: Secondary | ICD-10-CM | POA: Diagnosis present

## 2011-03-30 DIAGNOSIS — I10 Essential (primary) hypertension: Secondary | ICD-10-CM | POA: Diagnosis present

## 2011-03-30 DIAGNOSIS — Z888 Allergy status to other drugs, medicaments and biological substances status: Secondary | ICD-10-CM

## 2011-03-30 DIAGNOSIS — I2582 Chronic total occlusion of coronary artery: Secondary | ICD-10-CM | POA: Diagnosis present

## 2011-03-30 DIAGNOSIS — Z882 Allergy status to sulfonamides status: Secondary | ICD-10-CM

## 2011-03-30 LAB — DIFFERENTIAL
Basophils Absolute: 0 10*3/uL (ref 0.0–0.1)
Basophils Relative: 0 % (ref 0–1)
Eosinophils Absolute: 0.1 10*3/uL (ref 0.0–0.7)
Monocytes Relative: 7 % (ref 3–12)
Neutro Abs: 10 10*3/uL — ABNORMAL HIGH (ref 1.7–7.7)
Neutrophils Relative %: 77 % (ref 43–77)

## 2011-03-30 LAB — CBC
HCT: 39.1 % (ref 39.0–52.0)
Hemoglobin: 12.3 g/dL — ABNORMAL LOW (ref 13.0–17.0)
MCH: 24.2 pg — ABNORMAL LOW (ref 26.0–34.0)
MCHC: 31.5 g/dL (ref 30.0–36.0)
RBC: 5.09 MIL/uL (ref 4.22–5.81)

## 2011-03-30 LAB — BASIC METABOLIC PANEL
BUN: 33 mg/dL — ABNORMAL HIGH (ref 6–23)
Chloride: 108 mEq/L (ref 96–112)
GFR calc Af Amer: 63 mL/min — ABNORMAL LOW (ref >90)
Potassium: 4 mEq/L (ref 3.5–5.1)
Sodium: 142 mEq/L (ref 135–145)

## 2011-03-30 LAB — PRO B NATRIURETIC PEPTIDE: Pro B Natriuretic peptide (BNP): 9608 pg/mL — ABNORMAL HIGH (ref 0–125)

## 2011-03-30 LAB — PROTIME-INR
INR: 1.24 (ref 0.00–1.49)
Prothrombin Time: 15.9 seconds — ABNORMAL HIGH (ref 11.6–15.2)

## 2011-03-30 LAB — POCT I-STAT TROPONIN I

## 2011-03-30 LAB — APTT: aPTT: 29 seconds (ref 24–37)

## 2011-03-30 LAB — CK TOTAL AND CKMB (NOT AT ARMC): Relative Index: 7.5 — ABNORMAL HIGH (ref 0.0–2.5)

## 2011-03-30 NOTE — Telephone Encounter (Signed)
I spoke with pt and he states he has an HFU w/ PW tomorrow in [place]. Pt states he is experiencing some SOB, dry hacking cough. Pt states he was dx w/ dbl PNA on Monday by [place] [facility] and was given an abx to take. Pt states he is not sure the name but knows he takes it BID. Pt states he would like to come in and be seen this afternoon. I advised pt we only had 2 physicians this afternoon and was booked up. I advised pt if he feels like he can't wait for his apt tomorrow then he needs to go to the ED ASAP or if he gets worse. Pt states if he gets worse then he will but he is going to try to wait until he see's PW tomorrow. Nothing further was needed

## 2011-03-31 ENCOUNTER — Institutional Professional Consult (permissible substitution): Payer: Medicare Other | Admitting: Critical Care Medicine

## 2011-03-31 DIAGNOSIS — I509 Heart failure, unspecified: Secondary | ICD-10-CM

## 2011-03-31 LAB — COMPREHENSIVE METABOLIC PANEL
ALT: 35 U/L (ref 0–53)
AST: 32 U/L (ref 0–37)
Alkaline Phosphatase: 80 U/L (ref 39–117)
CO2: 26 mEq/L (ref 19–32)
Chloride: 105 mEq/L (ref 96–112)
GFR calc Af Amer: 62 mL/min — ABNORMAL LOW (ref >90)
GFR calc non Af Amer: 54 mL/min — ABNORMAL LOW (ref >90)
Glucose, Bld: 126 mg/dL — ABNORMAL HIGH (ref 70–99)
Potassium: 3.9 mEq/L (ref 3.5–5.1)
Sodium: 141 mEq/L (ref 135–145)

## 2011-03-31 LAB — GLUCOSE, CAPILLARY
Glucose-Capillary: 197 mg/dL — ABNORMAL HIGH (ref 70–99)
Glucose-Capillary: 181 mg/dL — ABNORMAL HIGH (ref 70–99)

## 2011-03-31 LAB — LIPID PANEL
HDL: 27 mg/dL — ABNORMAL LOW (ref >39)
LDL Cholesterol: 125 mg/dL — ABNORMAL HIGH (ref 0–99)

## 2011-03-31 LAB — CARDIAC PANEL(CRET KIN+CKTOT+MB+TROPI)
CK, MB: 8.4 ng/mL (ref 0.3–4.0)
CK, MB: 5.8 ng/mL — ABNORMAL HIGH (ref 0.3–4.0)
Relative Index: 6.9 — ABNORMAL HIGH (ref 0.0–2.5)
Relative Index: INVALID (ref 0.0–2.5)
Total CK: 121 U/L (ref 7–232)
Troponin I: 3.22 ng/mL (ref <0.30)

## 2011-03-31 LAB — CBC
HCT: 35.9 % — ABNORMAL LOW (ref 39.0–52.0)
Hemoglobin: 10.9 g/dL — ABNORMAL LOW (ref 13.0–17.0)
RBC: 4.72 MIL/uL (ref 4.22–5.81)
WBC: 9.2 10*3/uL (ref 4.0–10.5)

## 2011-03-31 LAB — PRO B NATRIURETIC PEPTIDE: Pro B Natriuretic peptide (BNP): 14800 pg/mL — ABNORMAL HIGH (ref 0–125)

## 2011-03-31 LAB — HEPARIN LEVEL (UNFRACTIONATED)
Heparin Unfractionated: 0.15 IU/mL — ABNORMAL LOW (ref 0.30–0.70)
Heparin Unfractionated: 0.18 IU/mL — ABNORMAL LOW (ref 0.30–0.70)

## 2011-03-31 LAB — TSH: TSH: 1.934 u[IU]/mL (ref 0.350–4.500)

## 2011-03-31 LAB — MRSA PCR SCREENING: MRSA by PCR: NEGATIVE

## 2011-04-01 ENCOUNTER — Other Ambulatory Visit: Payer: Medicare Other | Admitting: *Deleted

## 2011-04-01 ENCOUNTER — Ambulatory Visit: Payer: Medicare Other | Admitting: Internal Medicine

## 2011-04-01 DIAGNOSIS — I701 Atherosclerosis of renal artery: Secondary | ICD-10-CM

## 2011-04-01 DIAGNOSIS — I2510 Atherosclerotic heart disease of native coronary artery without angina pectoris: Secondary | ICD-10-CM

## 2011-04-01 LAB — BASIC METABOLIC PANEL
BUN: 29 mg/dL — ABNORMAL HIGH (ref 6–23)
GFR calc non Af Amer: 52 mL/min — ABNORMAL LOW (ref >90)
Glucose, Bld: 140 mg/dL — ABNORMAL HIGH (ref 70–99)
Potassium: 4.1 mEq/L (ref 3.5–5.1)

## 2011-04-01 LAB — POCT I-STAT 3, VENOUS BLOOD GAS (G3P V)
Bicarbonate: 25.2 mEq/L — ABNORMAL HIGH (ref 20.0–24.0)
O2 Saturation: 65 %
O2 Saturation: 96 %
pCO2, Ven: 38.6 mmHg — ABNORMAL LOW (ref 45.0–50.0)
pO2, Ven: 34 mmHg (ref 30.0–45.0)
pO2, Ven: 84 mmHg — ABNORMAL HIGH (ref 30.0–45.0)

## 2011-04-01 LAB — GLUCOSE, CAPILLARY: Glucose-Capillary: 119 mg/dL — ABNORMAL HIGH (ref 70–99)

## 2011-04-01 LAB — CBC
HCT: 36.2 % — ABNORMAL LOW (ref 39.0–52.0)
Hemoglobin: 11 g/dL — ABNORMAL LOW (ref 13.0–17.0)
MCV: 76.9 fL — ABNORMAL LOW (ref 78.0–100.0)
WBC: 10.2 10*3/uL (ref 4.0–10.5)

## 2011-04-01 LAB — POCT ACTIVATED CLOTTING TIME: Activated Clotting Time: 144 seconds

## 2011-04-02 DIAGNOSIS — I5023 Acute on chronic systolic (congestive) heart failure: Secondary | ICD-10-CM

## 2011-04-02 LAB — BASIC METABOLIC PANEL
Calcium: 9.6 mg/dL (ref 8.4–10.5)
Creatinine, Ser: 1.5 mg/dL — ABNORMAL HIGH (ref 0.50–1.35)
GFR calc Af Amer: 53 mL/min — ABNORMAL LOW (ref >90)

## 2011-04-02 LAB — CBC
MCH: 23.6 pg — ABNORMAL LOW (ref 26.0–34.0)
MCHC: 30.6 g/dL (ref 30.0–36.0)
MCV: 77.2 fL — ABNORMAL LOW (ref 78.0–100.0)
Platelets: 179 10*3/uL (ref 150–400)
RDW: 15.8 % — ABNORMAL HIGH (ref 11.5–15.5)

## 2011-04-02 LAB — GLUCOSE, CAPILLARY: Glucose-Capillary: 141 mg/dL — ABNORMAL HIGH (ref 70–99)

## 2011-04-03 DIAGNOSIS — I501 Left ventricular failure: Secondary | ICD-10-CM

## 2011-04-03 DIAGNOSIS — J189 Pneumonia, unspecified organism: Secondary | ICD-10-CM

## 2011-04-03 LAB — GLUCOSE, CAPILLARY
Glucose-Capillary: 174 mg/dL — ABNORMAL HIGH (ref 70–99)
Glucose-Capillary: 161 mg/dL — ABNORMAL HIGH (ref 70–99)

## 2011-04-03 LAB — CBC
MCHC: 30.4 g/dL (ref 30.0–36.0)
MCV: 77.1 fL — ABNORMAL LOW (ref 78.0–100.0)
Platelets: 193 10*3/uL (ref 150–400)
RDW: 15.8 % — ABNORMAL HIGH (ref 11.5–15.5)
WBC: 8.8 10*3/uL (ref 4.0–10.5)

## 2011-04-03 LAB — BASIC METABOLIC PANEL
Chloride: 107 mEq/L (ref 96–112)
Creatinine, Ser: 1.6 mg/dL — ABNORMAL HIGH (ref 0.50–1.35)
GFR calc Af Amer: 49 mL/min — ABNORMAL LOW (ref >90)
GFR calc non Af Amer: 42 mL/min — ABNORMAL LOW (ref >90)

## 2011-04-05 NOTE — Progress Notes (Signed)
Mr. Borak came in for evaluation of palpitations. His vital signs were checked, EKG performed, and lab work was drawn including electrolytes, magnesium and TSH. The physician on call the office increased his metoprolol.

## 2011-04-08 NOTE — Consult Note (Signed)
Aaron [name], Aaron [name]              ACCOUNT NO.:  [id number]  MEDICAL RECORD NO.:  [id number]  LOCATION:  2006                         FACILITY:  [facility]  PHYSICIAN:  Jonice Cerra D. Maple [name], MD, FCCP, FACPDATE OF BIRTH:  1942/04/07  DATE OF CONSULTATION:  04/03/2011 DATE OF DISCHARGE:  04/03/2011                                CONSULTATION   PROBLEM FOR CONSULTATION:  A 70 year old man with 15-pack-year smoking history ending in the 1970s, seen at the request of Dr. Tenny Craw because of question of recent pneumonia.  He had been scheduled to see Dr. Shan Levans as an outpatient last week because of persistent pulmonary infiltrate.  With that, he was hospitalized for cardiac evaluation.  He has had a history of episodic, viral pattern, bronchitis, but otherwise no diagnosed lung disease.  He had quit smoking in the 1970s when he was approximately 70 years old.  He denies diagnosis of asthma or prior pneumonia.  He has had significant history of cardiovascular disease.  On March 03, 2011, while vacationing at [facility], he coughed blood and presented to an Urgent Care where a chest x-ray was interpreted as pneumonia versus heart failure.  He returned to [facility] where he was treated for pneumonia with 4 different antibiotics.  He remembers a dry hacking cough with blood-tinged phlegm, but no fever and nothing purulent.  No chest pain.  CT scan of the chest at [facility] on February 07, 2011, image reviewed by me, showed mediastinal adenopathy, cardiac enlargement, enlarged pulmonary arteries, bilateral pleural effusions and patchy ground-glass parenchymal density, particularly in the right upper lobe, but less in the left upper lobe and bases, interpreted as most consistent with residual pneumonia.  An incidental chronic abnormality at the tail of pancreas is mentioned, but not addressed by this consultation.  He was scheduled to see Dr. Delford Field last week, but because of increasing  shortness of breath, was evaluated by Cardiology and admitted for the current admission.  Workup has included cardiac catheterization showing patent grafts, LV ejection fraction around 30%.  Cardiac meds are being adjusted.  He has been continued on Ceftin 500 mg p.o. b.i.d.  He has not been treated with bronchodilators or steroids.  REVIEW OF SYSTEMS:  Was discussed with the patient on 12-point exam. Significant now for some dyspnea with exertion, but comfortable at rest on room air with no routine cough or phlegm.  No chest pain.  He is not aware of peripheral adenopathy.  No more hemoptysis.  No leg pain or swelling.  FAMILY HISTORY:  Parents and sister died of myocardial infarction.  SOCIAL HISTORY:  Married, working as a Optician, dispensing.  No tobacco currently. As noted, no alcohol or recreational drugs.  No routine exercise.  PAST MEDICAL HISTORY:  Significant for: 1. Non-ST MI with coronary artery disease.  Drug-eluting stent,     coronary artery bypass graft x3. 2. Left thalamic cerebrovascular accident in 2007. 3. Peripheral vascular disease with renal artery stent. 4. Carotid artery stenosis. 5. Hypertension. 6. Hyperlipidemia. 7. Ischemic cardiomyopathy with ejection fraction 30% on current     catheterization. 8. History of prostate cancer, status post radical prostatectomy. 9. Nephrolithiasis. 10.Perforated diverticulitis requiring colostomy in 2011  with takedown     in 2012. 11.Symptomatic premature ventricular contraction.  MEDICATION ALLERGIES: 1. SULFA. 2. LEVAQUIN. 3. ACE INHIBITORS (cough). 4. HYDRALAZINE (palpitation).  OBJECTIVE:  GENERAL:  Pleasant, alert, oriented, cooperative gentleman, sitting up in a chair, breathing comfortably on room air with unlabored speech. VITAL SIGNS:  BP 148/73, pulse regular 55, respirations about 17, temperature 98.6, room air saturation 96%.  Temperature log does not record elevation during this hospital stay. NEUROLOGIC:   Grossly intact. SKIN:  Bruising on forearms consistent with anticoagulation. ADENOPATHY:  None found at the neck, supraclavicular, or axillary areas. HEENT:  Oral mucosa normally hydrated.  Speech is clear.  No neck vein distention.  I do not hear carotid bruits.  There is no stridor. CHEST:  Clear on quiet breathing and no cough or wheeze with deep breath.  I cannot percuss dullness and hear no lateralizing findings. HEART:  Slow regular rhythm.  I do not hear a murmur, but note admission exam reported a grade 2/6 diastolic murmur at the left upper sternal border. ABDOMEN:  Nontender with bowel sounds present.  I cannot feel liver or spleen edge. GENITALIA/RECTAL:  Not examined.  Not pertinent. EXTREMITIES:  No edema bilaterally at this time.  Bilateral dorsalis pedis pulse.  LABORATORY DATA:  Chest x-ray and CT images reviewed.  Faint patchy infiltrate, right upper lobe and in both lung bases with very small effusion.  Hazy right hilar markings suggest edema.  WBC on admission was 10,200 and today 8800, hemoglobin ranging 10.2 to 11.2.  BUN 38; creatinine 1.6, up from 1.34; glucose 132.  IMPRESSION:  Persistent pulmonary infiltrates.  The pattern is nonspecific.  I do not get a history from the patient that he has had fever or purulent sputum at any point to mark an obvious acute infection.  I am inclined to favor this being persistent interstitial edema.  Asymmetry may reflect some underlying obstructive lung disease, but that is not clear from the CT scan.  Chest exam is unremarkable now.  PLAN: 1. I suggest he complete and stop his Ceftin after a total of 7-10     days and then stay off of antibiotics without specific indication. 2. Radiologic followup will be most helpful in way of assessing the     infiltrates.  Hopefully with cardiology attention, edema will     clear.  Otherwise, he may benefit from a followup CT scan for     comparison.  He had an     initial appointment  scheduled with Dr. Shan Levans, which he was     unable to keep because of this intervening hospital stay.  Suggest     at discharge he be directed to reschedule with Dr. Delford Field,     meanwhile maintaining internal medicine followup with his primary     physician, Dr. [name] in [place].     Brocha Gilliam D. Maple [name], MD, [facility], FACP     CDY/MEDQ  D:  04/03/2011  T:  04/03/2011  Job:  [id number]  cc:   Pricilla Riffle, MD, [facility] [name], MD  Electronically Signed by Jetty Duhamel MD [facility] FACP on 04/08/2011 07:45:40 PM

## 2011-04-08 NOTE — Discharge Summary (Addendum)
NAMEABENEZER, ODONELL NO.:  [id number]  MEDICAL RECORD NO.:  [id number]  LOCATION:  2006                         FACILITY:  [facility]  PHYSICIAN:  [name], MD       DATE OF BIRTH:  Nov 05, 1940  DATE OF ADMISSION:  03/30/2011 DATE OF DISCHARGE:  04/03/2011                              DISCHARGE SUMMARY   DISCHARGE SECOND DIAGNOSES: 1. Non-ST segment elevation myocardial infarction this     admission/coronary artery disease.     a.     Status post catheterization on April 01, 2011,      demonstrating patent bypass grafts and severe two-vessel coronary      artery disease involving the left anterior descending artery and      left circumflex with patent stent to the right coronary artery,      for continued medical therapy.     b.     Status post percutaneous coronary intervention and drug-      eluting stent placement to the right coronary artery and left      circumflex in April 2008.     c.     Catheterization in April 2009 showing occlusion of the left      circumflex.     d.     Non-ST segment elevation myocardial infarction on May 11, 2010, with catheterization showing progression of the left      anterior descending artery as well as subtotal occlusion of the      left circumflex with prior stent placed in patent right coronary      artery.     e.     On May 14, 2010, status post coronary artery bypass      graft x3 with left internal mammary artery to left anterior      descending artery, vein graft to the first obtuse marginal and      vein graft to the posterior descending artery.     fEugenie Birks Myoview in October 2012 showing small fixed      inferoapical, moderate inferolateral defect suggestive of infarct      without ischemia. 2. Acute-on-chronic ischemic cardiomyopathy/acute-on-chronic systolic     congestive heart failure.     a.     Ejection fraction of 30% by catheterization on April 01, 2011, with of ejection  fraction of 25% with diastolic dysfunction      by echocardiogram in September 2012. 3. Acute renal insufficiency, discharge creatinine of 1.6. 4. Peripheral vascular disease.     a.     Status post left renal artery stenting in the late 1990s,      patent renal arteries by PV, peripheral vascular angio on April 01, 2011.     b.     Bilateral 48-59% carotid stenosis. 5. Hypertension with hypertensive urgency this admission.  SECONDARY DISCHARGE DIAGNOSES: 1. History of left thalamic cerebrovascular accident in 2007. 2. Hypertension. 3. Hyperlipidemia. 4. Right upper lobe and bilateral lower lobe pneumonia, initially     scheduled to see outpatient Pulmonology, but seen as an inpatient.  a.     Dr. Maple [name] favors is being asymmetric pulmonary edema,      probably with some underlying chronic obstructive pulmonary      disease.  To complete a 7 day course of Ceftin. 5. History of prostate cancer, status post prostatectomy. 6. History of nephrolithiasis. 7. History of perforated diverticulitis, status post colostomy in     November 2001, but takedown in June 2012. 8. History of symptomatic premature ventricular contractions.  HOSPITAL COURSE:  Mr. Riecke is a 70 year old gentleman with a complex past medical history including coronary artery disease, ischemic cardiomyopathy, peripheral vascular disease, and hypertension.  He has had issues with progressive dyspnea and findings of right-sided pneumonia and has been on several antibiotics in the past month prescribed by his PCP here.  He simultaneously has had issues with volume excess and has been seen in our office by [name], [facility] for adjustment with his diuretic therapy.  His volume was felt to be improving on March 22, 2011, but he did require additional diuresis. Upon ordering of his PCP related to persistent pneumonia, the patient had chest CT at [facility] on March 23, 2011, showing right upper lobe and  bilateral lower lobe airspace opacities.  Over the past few days, the patient has had dyspnea and increased orthopnea as well as left arm pain.  He presented to the ER and had a blood pressure of 219/95 and troponin was elevated at 6.59.  This eventually came down. He was treated with IV heparin with plans for catheterization once his volume status improved.  He was diuresed with IV Lasix with improvement in his fluid status.  Lasix was held the day of catheterization.  Of note, because of his lower extremity edema, he also had lower extremity Dopplers, which were negative.  Cardiac catheterization showed the above findings and Dr. Excell Seltzer recommended continued medical therapy.  This included the addition of hydralazine and Imdur to his medicine regimen, along with Crestor, which was changed to Lipitor at discharge because of his renal function.  Pulmonary consult was requested and Dr. Maple [name] saw the patient for this question of persistent pneumonia.  Dr. Maple [name] suggested this may instead be asymmetric pulmonary edema probably with some underlying COPD.  Residual hemorrhage, CHF for pneumonia should clear or shift on follow up.  Dr. Maple [name] recommended to finish 7 days of Ceftin and stay off antibiotics.  They will see the patient in follow up as an outpatient.  On day of discharge, the patient is doing well and wants to go home.  He is afebrile.  His creatinine has increased slightly, so his Lasix will be decreased to 80 mg daily per discussion with Dr. Johney Frame.  Dr. Johney Frame would like to continue his other current medications.  Dr. Johney Frame seen and examined him today and feels he is stable for discharge.  DISCHARGE LABORATORY DATA:  WBC 8.8, hemoglobin 10.2, hematocrit 33.6, platelet count 193.  Sodium 144, potassium 4.4, chloride 107, CO2 27, glucose 132, BUN 38, creatinine 1.0.  TSH 1.934, LDL was 125.  STUDIES: 1. Chest x-ray on March 30, 2011, showed patchy right upper lobe and      bilateral lobe opacities as seen on prior CT.  Small effusions.     Cardiomegaly. 2. Cardiac catheterization on April 01, 2011, please see full report     for details as well as HPI for summary.  MEDICATIONS: 1. Hydralazine 25 mg t.i.d., which is new. 2. Isordil 20 mg t.i.d. 3. Lipitor 40  mg at bedtime. 4. Coreg 12.5 mg one and a half tablets b.i.d. 5. Lasix 40 mg two tablets daily. 6. Potassium chloride 20 mEq daily. 7. Aspirin 81 mg daily. 8. Cefuroxime 500 mg b.i.d. with instructions to complete a 7 day     course. 9. Clonidine 0.1 mg b.i.d. 10.Diovan 320 mg daily. 11.Lansoprazole 30 mg daily. 12.Nasonex 400 mg b.i.d. 13.Multivitamin one tablet daily. 14.Nitroglycerin sublingual 0.4 mg every 5 minutes as needed up to 3     doses for chest pain. 15.Plavix 75 mg at bedtime. 16.Spironolactone 25 mg daily.  DISPOSITION:  Mr. Derosia will be discharged in stable condition to home.  He is to increase his activity slowly and is not to lift anything over 5 pounds for 1 week, drive for 2 days or participate in sexual activity for 1 week.  He should call or return for pain, swelling, bleeding or pus at his cath site.  He is to follow a low-sodium, heart- healthy diet.  He is to call Dr. Lynelle Doctor office to reschedule his appointment as he was in the hospital during his other outpatient appointments.  He will follow up with [name], PA-C as an outpatient in the following week and have a BMET at that time as well to ensure stability of his renal function.  DURATION OF DISCHARGE ENCOUNTERED:  Greater than 30 minutes including physician and PA time.     Ronie Spies, P.A.C.   ______________________________ [name], MD    DD/MEDQ  D:  04/03/2011  T:  04/04/2011  Job:  [id number]  cc:   Thomas C. Daleen Squibb, MD, [facility] [name]. Delford Field, MD, [facility]  Electronically Signed by Ronie Spies  on 04/08/2011 09:03:03 PM Electronically Signed by [name] MD on 04/21/2011 02:32:35 PM

## 2011-04-10 NOTE — Cardiovascular Report (Signed)
Aaron [name], Aaron [name]              ACCOUNT NO.:  [id number]  MEDICAL RECORD NO.:  [id number]  LOCATION:  2916                         FACILITY:  [facility]  PHYSICIAN:  Veverly Fells. Excell Seltzer, MD  DATE OF BIRTH:  [birth year]  DATE OF PROCEDURE:  04/01/2011 DATE OF DISCHARGE:                           CARDIAC CATHETERIZATION   PROCEDURE: 1. Right heart catheterization. 2. Left heart catheterization. 3. Selective coronary angiography. 4. Left ventricular angiography. 5. Bilateral selective renal angiography.  PROCEDURAL INDICATION:  Aaron [name] is a 70 year old gentleman with extensive coronary artery disease.  He has underwent coronary bypass surgery in 2011.  He presented with congestive heart failure and non-ST elevation infarction.  He also has had malignant hypertension.  He was referred for right and left heart catheterization with graft and coronary angiography as well as renal angiography.  Risks and indication of the procedure were reviewed with the patient. Alternatives were reviewed.  We discussed with the patient that there was a high chance of procedural success.  He agreed with the above and we elected to proceed.  The right groin was prepped, draped, and anesthetized with 1% lidocaine. Using the modified Seldinger technique, a 5-French sheath was placed in the right femoral artery, and a 7-French sheath was placed in the right femoral vein.  Standard Judkins catheters were used for coronary angiography, left ventriculography, LIMA angiography, saphenous vein graft angiography.  The JR-4 catheter was brought down over a J-wire and inserted into the renal arteries for selective bilateral renal angiography.  A Swan-Ganz catheter was used for the right heart catheterization.  The patient tolerated the procedure well.  There were no immediate complications.  PROCEDURAL FINDINGS:  Right atrial pressure mean of 13, right ventricular pressure 56/13, pulmonary artery pressure  49/16 with a mean of 28, pulmonary capillary wedge pressure mean of 21, left ventricular pressure 154/23, aortic pressure 150/66 with a mean of 97.  Oxygen saturation, pulmonary artery 65%, central aorta 96%.  Cardiac output by the Fick method 5.4 L per minute.  Cardiac index 2.9 L per minute per meter squared.  Left ventriculography shows global LV hypokinesis.  The estimated left ventricular ejection fraction is 30%.  Renal angiography:  The left renal artery is patent.  There is a stent in the ostium of the vessel with mild 30% in-stent restenosis involving the proximal third of the stent.  There is no obstructive disease noted.  The right renal artery is widely patent with no significant stenosis.  Left mainstem:  The left main is patent.  It divides into the LAD and left circumflex.  LAD:  The LAD has diffuse segmental disease in the proximal and mid vessel.  The proximal LAD has 90% stenosis between the first and second diagonal branches and before the first septal perforator.  The mid LAD has 80% stenosis.  The LIMA to LAD is patent, and the distal vessel fills from the LIMA graft.  Left circumflex:  The left circumflex is patent.  It courses down the AV groove, and it is a small vessel supplying a posterolateral branch.  The first OM is totally occluded within the stent and that vessel fills from graft flow.  Right coronary artery:  The RCA is heavily calcified.  It is a dominant vessel.  There is a patent stent in the distal vessel.  There is mild diffuse disease throughout the course of the RCA.  Saphenous vein graft angiography:  Saphenous vein graft to PDA is widely patent.  It fills the PDA and the posterolateral branch.  Saphenous vein graft to OM is widely patent.  There is no obstructive disease.  LIMA to LAD.  LIMA to LAD is widely patent again with no obstructive disease.  FINAL ASSESSMENT: 1. Severe global left ventricular systolic dysfunction with  estimated     left ventricular ejection fraction 30%. 2. Severe 2 vessel coronary artery disease involving the left anterior     descending artery and left circumflex with a patent stent in the     right coronary artery. 3. Status post coronary bypass surgery with patency of all bypass     grafts (saphenous vein graft to posterior descending artery,     saphenous vein graft to obtuse marginal, left internal mammary     artery to left anterior descending artery). 4. Patent renal arteries bilaterally. 5. Elevated intracardiac filling pressures.  RECOMMENDATIONS:  We will continue medical treatment for cardiomyopathy and congestive heart failure.     Veverly Fells. Excell Seltzer, MD     MDC/MEDQ  D:  04/01/2011  T:  04/01/2011  Job:  [id number]  Electronically Signed by [name] MD on 04/10/2011 01:17:05 AM

## 2011-04-14 ENCOUNTER — Institutional Professional Consult (permissible substitution): Payer: Medicare Other | Admitting: Internal Medicine

## 2011-04-15 ENCOUNTER — Encounter: Payer: Self-pay | Admitting: Physician Assistant

## 2011-04-15 ENCOUNTER — Telehealth: Payer: Self-pay | Admitting: *Deleted

## 2011-04-15 ENCOUNTER — Ambulatory Visit (INDEPENDENT_AMBULATORY_CARE_PROVIDER_SITE_OTHER): Payer: Medicare Other | Admitting: Physician Assistant

## 2011-04-15 DIAGNOSIS — I2510 Atherosclerotic heart disease of native coronary artery without angina pectoris: Secondary | ICD-10-CM

## 2011-04-15 DIAGNOSIS — E785 Hyperlipidemia, unspecified: Secondary | ICD-10-CM

## 2011-04-15 DIAGNOSIS — I4949 Other premature depolarization: Secondary | ICD-10-CM

## 2011-04-15 DIAGNOSIS — R059 Cough, unspecified: Secondary | ICD-10-CM

## 2011-04-15 DIAGNOSIS — R0600 Dyspnea, unspecified: Secondary | ICD-10-CM

## 2011-04-15 DIAGNOSIS — I493 Ventricular premature depolarization: Secondary | ICD-10-CM

## 2011-04-15 DIAGNOSIS — I255 Ischemic cardiomyopathy: Secondary | ICD-10-CM

## 2011-04-15 DIAGNOSIS — R05 Cough: Secondary | ICD-10-CM

## 2011-04-15 DIAGNOSIS — I5022 Chronic systolic (congestive) heart failure: Secondary | ICD-10-CM

## 2011-04-15 DIAGNOSIS — I10 Essential (primary) hypertension: Secondary | ICD-10-CM

## 2011-04-15 LAB — BASIC METABOLIC PANEL
BUN: 51 mg/dL — ABNORMAL HIGH (ref 6–23)
Calcium: 8.7 mg/dL (ref 8.4–10.5)
Creatinine, Ser: 1.9 mg/dL — ABNORMAL HIGH (ref 0.4–1.5)
GFR: 37.31 mL/min — ABNORMAL LOW (ref >60.00)
Glucose, Bld: 147 mg/dL — ABNORMAL HIGH (ref 70–99)
Sodium: 140 mEq/L (ref 135–145)

## 2011-04-15 MED ORDER — PRAVASTATIN SODIUM 20 MG PO TABS: 20.0000 mg | ORAL_TABLET | Freq: Every evening | ORAL | Status: DC

## 2011-04-15 NOTE — Progress Notes (Signed)
History of Present Illness: Primary Cardiologist:  Dr. Valera Castle   Aaron [name] is a 70 y.o. male who returns for post hospital follow up.    He has a history of CAD with prior stenting to the CFX and RCA.  He presented to [facility] in 11/11 with NSTEMI and underwent CABG: L-LAD, S-OM, S-PDA.  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  Other hx includes: carotid stenosis (bilat 40-59% in 8/11), renal insufficiency, renal artery stenosis s/p prior stenting, prior stroke, HTN, HLP, prostate CA s/p prostatectomy.  He was evaluated by Dr. Johney Frame recently for symptomatic PVCs and was noted to be in acute CHF.  Follow up echo:  Mild LVH, EF 25%, restrictive diast dysfxn with elevated filling pressures, mild AI, mild enlarged Ao root 36 mm, mild MR, mod LAE, mild RVE, mod reduced RVSF, mild to mod PR, PASP 62-66 (mod pulmonary HTN), mod pleural effusion.  His diuretics were adjusted and he was seen in the office on a couple of occasions to manage his CHF and BP.  He was also following with his PCP in [facility] for treatment of a recent pneumonia.  A recent CT had demonstrated RUL and bilat LL airspace opacities and he was to see Dr. Delford Field for evaluation as an outpatient.    He was admitted 10/10-10/14 with worsening dyspnea, orthopnea and left arm pain.  BP was reportedly 219/95 in the ED.  He ruled in for NSTEMI.  He was still volume overloaded and he was diuresed with IV Lasix.  LE dopplers were negative for DVT.  He underwent LHC 04/01/11: EF 30%, left RA with 30% ISR, right RA patent, RCA stent patent, severe disease in LAD and CFX, patent L-LAD, patent S-PDA, S-OM.  Medical rx was continued.  He was seen by Dr. Maple [name] who felt that his CXR findings were likely related to asymmetric edema and he recommended finishing the antibxs and to follow up as an OP with pulmonary.  Of note, creatinine did  bump prior to discharge and his diuretics were reduced.  Hospital Labs: hgb 10.2, MCV 77.1, K 4.4, Creatinine 1.6, BNP 4306, TSH 1.934, TC 183, TG 156, HDL 27, LDL 125  Doing much better.  Describes Class 2 DOE.  No orthopnea, PND.  Right leg always swollen.  Otherwise edema much better.  Weights at home stable.  No chest  Pain.  No syncope.  Still has palpitations.  Notes slight cough.  Feels increased drainage from sinuses.  No purulent sputum or hemoptysis.  Had to decrease hydralazine to 25 mg QD due to symptomatic hypotension.    Past Medical History  Diagnosis Date  . Hyperlipidemia   . Hypertension   . CAD (coronary artery disease)     NSTEMI 11/11 -  cath: EF 40%, pLAD 30%, 90%, 30-40%, CFX stent occluded, pRCA 30%, dRCA stent ok, pPDA 40%, 30%;  s/p CABG 11/11;LHC 04/01/11: EF 30%, left RA with 30% ISR, right RA patent, RCA stent patent, severe disease in LAD and CFX, L-LAD, S-PDA, S-OM ok    . Prostate cancer     2006, s/p radical prostatectomy at [facility]  . Stroke     2007  . Injury of sigmoid colon     perforation requiring hemicolectomy and colostomy with subsequent takedown  . Renal artery stenosis     s/p prior ptca  . Chronic systolic heart failure  echo 9/12: Mild LVH, EF 25%, restrictive diast dysfxn with elevated filling pressures, mild AI, mild enlarged Ao root 36 mm, mild MR, mod LAE, mild RVE, mod reduced RVSF, mild to mod PR, PASP 62-66 (mod pulmonary HTN), mod pleural effusion  . Ischemic cardiomyopathy   . Carotid stenosis     bilat 40-59% in 8/11  . Diabetes mellitus   . Atrial fibrillation   . Acute myocardial infarction, subendocardial infarction, subsequent episode of care   . Depression   . Gout   . Anxiety   . Osteoarthritis   . Pneumonia     Current Outpatient Prescriptions  Medication Sig Dispense Refill  . aspirin EC 81 MG tablet Take 81 mg by mouth daily.        . carvedilol (COREG) 12.5 MG tablet Take 18.75 mg by mouth 2 (two) times  daily.        . cloNIDine (CATAPRES) 0.1 MG tablet Take 1 tablet (0.1 mg total) by mouth 2 (two) times daily.      Marland Kitchen Clopidogrel Bisulfate (PLAVIX PO) Take 75 mg by mouth daily.       . furosemide (LASIX) 40 MG tablet Take 40 mg by mouth 2 (two) times daily.        Marland Kitchen guaiFENesin (MUCINEX) 600 MG 12 hr tablet Take 400 mg by mouth 2 (two) times daily.       . hydrALAZINE (APRESOLINE) 25 MG tablet Take 1 tablet by mouth daily.      . isosorbide dinitrate (ISORDIL) 20 MG tablet Take 1 tablet by mouth Three times a day.      . lansoprazole (PREVACID) 30 MG capsule Take 30 mg by mouth daily.        . metFORMIN (GLUMETZA) 500 MG (MOD) 24 hr tablet Take 500 mg by mouth daily with breakfast.        . nitroGLYCERIN (NITROSTAT) 0.4 MG SL tablet Place 0.4 mg under the tongue every 5 (five) minutes as needed.        . potassium chloride SA (K-DUR,KLOR-CON) 20 MEQ tablet Take 20 mEq by mouth daily.        Marland Kitchen spironolactone (ALDACTONE) 25 MG tablet Take 1 tablet (25 mg total) by mouth daily.  30 tablet  11  . valsartan (DIOVAN) 320 MG tablet Take 320 mg by mouth daily.        Marland Kitchen DISCONTD: furosemide (LASIX) 40 MG tablet TAKE 80 MG IN THE MORNING AND TAKE 40 MG IN THE AFTERNOON DO THIS FOR 3 DAYS ONLY THEN RESUME BACK TO 40 MG TWICE DAILY  60 tablet  11  . DISCONTD: potassium chloride SA (KLOR-CON M20) 20 MEQ tablet TAKE 2 TABLETS ONCE DAILY FOR 3 DAYS ONLY WITH THE INCREASED DOSE OF LASIX FOR 3 DAYS; THEN RESUME BACK TO 20 MEQ ONCE DAILY  60 tablet  11    Allergies: Allergies  Allergen Reactions  . Sulfa Drugs Cross Reactors Rash    Legs only.  . Sulfonamide Derivatives Rash    Legs only.    Social Hx:  Ex-smoker.  Works as a Education officer, environmental.  ROS:  Please see the history of present illness.  All other systems reviewed and negative.   Vital Signs: BP 136/62  Pulse 55  Ht 5\' 6"  (1.676 m)  Wt 176 lb (79.833 kg)  BMI 28.41 kg/m2  PHYSICAL EXAM: Well nourished, well developed, in no acute distress HEENT:  normal Neck: JVP ~4-5 cm Cardiac:  normal S1, S2; RRR with premature beats;  no S3 Lungs:  Clear to auscultation, no wheezing or rales Abd: soft, nontender, no hepatomegaly Ext: 1+ RLE edema (chronic) no edema on left Skin: warm and dry; RFA site without hematoma or bruit Neuro:  CNs 2-12 intact, no focal abnormalities noted Psych: normal affect  EKG:  Sinus brady, HR 55, LAD, TW inversions 1, aVL, V6, no significant changes  ASSESSMENT AND PLAN:

## 2011-04-15 NOTE — Assessment & Plan Note (Signed)
Arrange follow up with Dr. Johney Frame.

## 2011-04-15 NOTE — Telephone Encounter (Signed)
DOD Dr. Tenny Craw reviewed lab results and recommended repeat BMP and BNP on Tuesday 04/19/11 Pt is aware and will come in for lab work. States he is feeling well today. Mylo Red RN

## 2011-04-15 NOTE — Assessment & Plan Note (Signed)
He has finished antibxs.  Sounds like he is describing sinus drainage.  He can try benadryl or claritin OTC PRN.  He needs to keep follow up with Dr. Delford Field and we will arrange.

## 2011-04-15 NOTE — Assessment & Plan Note (Signed)
Will likely plan on follow up echo in 2-3 months.  If EF remains less than 35%, will need to consider +/- AICD.

## 2011-04-15 NOTE — Assessment & Plan Note (Signed)
He stopped simvastatin due to fatigue.  Thinks this may have been from CHF now.  Will try Pravastatin 20 mg QHS.  Check L/L in 6 weeks.

## 2011-04-15 NOTE — Patient Instructions (Signed)
Your physician recommends that you schedule a follow-up appointment in: 4 WEEKS WITH DR. WALL PER SCOTT WEAVER, PA-C  Your physician has recommended you make the following change in your medication: INCREASE HYDRALAZINE 25MG   TABLET TAKE 1/2 (HALF) TWICE DAILY; START PRAVASTATIN 20 MG 1 TABLET EVERY NIGHT.  Your physician recommends that you return for lab work in: TODAY BMET, BNP 428.22 HF, 414.01 HTN   Your physician recommends that you return for lab work in: 05/20/11 FASTING LIVER/LIPID PANEL 414.01 CAD  Your physician recommends that you schedule a follow-up appointment in: 6-8 WEEKS WITH DR. ALLRED AS PER SCOTT WEAVER, PA-C  Your physician recommends that you schedule a follow-up appointment in: 05/06/11  @ 10:30 WITH DR. Delton Coombes

## 2011-04-15 NOTE — Assessment & Plan Note (Signed)
Recent NSTEMI.  Likely demand ischemia from CHF.  Grafts were open.  Continue ASA and Plavix.

## 2011-04-15 NOTE — Assessment & Plan Note (Signed)
Controlled.  

## 2011-04-15 NOTE — Assessment & Plan Note (Signed)
Volume appears stable.  Weights are good since coming home.  His creatinine did go up prior to discharge.  Will get follow up BMET and BNP today.  Continue current meds.  Except, try to take Hydralazine 25 mg 1/2 BID.  Hopefully he can tolerate this.  We discussed increasing activity, going to cardiac rehab and enrolling in the CHF clinic.  He would like to think about all of these.  Follow up with Dr. Daleen Squibb in 4 weeks.

## 2011-04-19 ENCOUNTER — Other Ambulatory Visit (INDEPENDENT_AMBULATORY_CARE_PROVIDER_SITE_OTHER): Payer: Medicare Other | Admitting: *Deleted

## 2011-04-19 DIAGNOSIS — R0600 Dyspnea, unspecified: Secondary | ICD-10-CM

## 2011-04-19 DIAGNOSIS — I2510 Atherosclerotic heart disease of native coronary artery without angina pectoris: Secondary | ICD-10-CM

## 2011-04-19 DIAGNOSIS — R0609 Other forms of dyspnea: Secondary | ICD-10-CM

## 2011-04-19 DIAGNOSIS — I5022 Chronic systolic (congestive) heart failure: Secondary | ICD-10-CM

## 2011-04-19 LAB — BASIC METABOLIC PANEL
CO2: 23 mEq/L (ref 19–32)
Calcium: 8.8 mg/dL (ref 8.4–10.5)
Creatinine, Ser: 1.7 mg/dL — ABNORMAL HIGH (ref 0.4–1.5)
Glucose, Bld: 204 mg/dL — ABNORMAL HIGH (ref 70–99)

## 2011-04-20 ENCOUNTER — Telehealth: Payer: Self-pay | Admitting: Cardiology

## 2011-04-20 DIAGNOSIS — I5022 Chronic systolic (congestive) heart failure: Secondary | ICD-10-CM

## 2011-04-20 NOTE — Telephone Encounter (Signed)
Pt would like a call back regarding repeat labwork done yesterday

## 2011-04-21 NOTE — Telephone Encounter (Signed)
lmtcb Debbie Brandelyn Henne RN  

## 2011-04-21 NOTE — Telephone Encounter (Signed)
Pt returning call to Debbie. Please call back.  

## 2011-04-21 NOTE — H&P (Signed)
Aaron [name], Aaron [name] NO.:  [id number]  MEDICAL RECORD NO.:  [id number]  LOCATION:  2916                         FACILITY:  [facility]  PHYSICIAN:  [name], MD       DATE OF BIRTH:  30-Aug-1940  DATE OF ADMISSION:  03/30/2011 DATE OF DISCHARGE:                             HISTORY & PHYSICAL   PRIMARY CARDIOLOGIST:  [name], MD, [facility].  ELECTROPHYSIOLOGIST:  [name], MD.  PRIMARY CARE PROVIDER:  [name], MD in [place].  The patient is scheduled to see [name]. Delford Field, MD, FCCP in the outpatient setting for Pulmonology.  PATIENT PROFILE:  A 70 year old male with history of CAD, ischemic cardiomyopathy, and poorly-controlled hypertension presents to the ED after 1-day history of progressive dyspnea in the setting of 55-month history of dyspnea with pneumonia and volume excess.  PROBLEM LIST: 1. Non ST-segment elevation myocardial infarction/coronary artery     disease.    a.     Status post percutaneous coronary intervention and drug-      eluting stent placement to the right coronary artery and left      circumflex in April 2008 by [name], MD.     b.     Catheterization in April 2009 showing an occlusion of the      left circumflex stent.     c.     Non ST-elevation myocardial infarction, May 11, 2010.      Catheterization showing progression of left anterior descending      coronary artery disease as well as subtotal occlusion of the left      circumflex at prior stent site and patent right coronary artery.     d.     May 14, 2010, coronary artery bypass grafting x3 with      left internal mammary artery graft  to the left anterior      descending coronary artery, vein graft to the first obtuse      marginal, vein graft to the posterior descending artery.     e.     [name] Myoview on March 29, 2011 showing a small fixed      inferoapical and moderate anterolateral defects suggestive of      infarct without  ischemia. 2. History of left thalamic cerebrovascular accident in 2007. 3. Peripheral vascular disease.     a.     Status post left renal artery stenting in the late 1990.     b.     Bilateral 40-59% carotid stenosis. 4. Hypertension. 5. Hyperlipidemia. 6. Ischemic cardiomyopathy/acute on chronic systolic congestive heart     failure.     a.     Ejection fraction 25% with diastolic dysfunction and PASP of      262-266 mmHg by echo September 2012. 7. Right upper lobe and bilateral lower lobe pneumonia, pending     Pulmonology evaluation. 8. History of prostate cancer, status post prostatectomy. 9. History of nephrolithiasis. 10.History of perforated diverticulitis, status post colostomy in     November 2011 with takedown in June 2012. 11.History of symptomatic premature ventricular contraction.  ALLERGIES:  SULFA, LEVAQUIN, ACE INHIBITORS (cough), HYDRALAZINE (palpitations).  HISTORY OF PRESENT ILLNESS:  A 70 year old male with the above complex problem list.  The patient has been seen frequently over the past month both in the Cardiology Office as well by his primary care provider related to progressive dyspnea and finding of right-sided pneumonia.  He has been on several antibiotics in the past months prescribed by his primary care provider.  He simultaneously has had issues with volume excess and has been seen by [name], PA-C in our office on several occasions for increased volume with diuretic adjustment and blood pressure management.  He was last seen on March 22, 2011, at which point, his volume was felt to be improving, but he did require additional diuresis.  The patient says his weight has been stable in the 175-176 range and he has even reduced his home dose of Lasix because he thought that he was getting dry.  Upon the ordering of his primary care provider related to persistent pneumonia, the patient had a chest CT at [facility] on March 23, 2011,  showing right upper lobe and bilateral lower lobe airspace opacities.  Antibiotics were changed to cefuroxime.  Over the past few days, the patient has had dyspnea and has increased and he reports a "rough night" occurring last night associated with orthopnea and dyspnea.  He has also had intermittent left arm pain.  Because of his dyspnea this morning, he called the office and was advised to present to the ED for further evaluation.  In the ED, he has been found to have an elevation of his troponin to 6.59, and we have been asked to evaluate. Chest x-ray here shows no significant change from recent CT.  Again showing patchy right upper lobe and bilateral lower lobe opacities.  The patient's blood pressure is markedly elevated here in the ED at 219/95 and also higher.  The patient currently denies any chest pain or dyspnea.  HOME MEDICATIONS: 1. Aspirin 81 mg daily. 2. Coreg 12.5 mg b.i.d. 3. Clonidine 0.1 mg b.i.d. 4. Plavix 75 mg daily. 5. Lasix 40 mg currently taking daily. 6. Mucinex 600 mg b.i.d. 7. Prevacid 30 mg daily. 8. Metformin 500 mg daily. 9. Nitroglycerin p.r.n. 10.Potassium chloride 20 mEq daily. 11.Diovan 320 mg p.o. daily. 12.Spironolactone 25 mg daily. 13.Cefuroxime 500 mg b.i.d.  FAMILY HISTORY:  Both parents and a sister died of MI.  SOCIAL HISTORY:  The patient lives in [place] with his wife.  He is a Statistician.  He previously smoked, but quit in the 1970s and has not had drink since 1960.  Denies drug use.  He is not routinely active secondary to his dyspnea over the past month.  REVIEW OF SYSTEMS:  He says that he has been in "hot and cold" but not necessarily had fevers or chills.  He has had dyspnea on exertion as well as orthopnea as outlined in the HPI.  He has had intermittent left arm pain over the past few days.  With all these antibiotics he has been on, he reports occasional diarrhea.  He is a full code.  Otherwise, all systems  reviewed and negative.  PHYSICAL EXAMINATION:  VITAL SIGNS:  Temperature 97.9, heart rate 77, respirations 24 blood pressure 219/95, pulse ox 97% on 2 L. GENERAL:  Pleasant white male in no acute distress.  Awake, alert, and oriented x3.  He has a normal affect. HEENT:  Normal.  Nares grossly intact.  Nonfocal. SKIN:  Warm and dry without lesions or masses. NECK:  Supple with JVP of approximately 12  cm.  Positive HJR. LUNGS:  Diminished breath sounds at bilateral bases.  Respirations otherwise regular and unlabored. CARDIAC:  Irregular, S1 and S2 with PVCs noted on the monitor.  The patient has a 2/6 diastolic murmur at the left upper sternal border. ABDOMEN:  Round, semi firm.  Bowel sounds present x4. EXTREMITIES:  Warm, dry, pink with trace left lower extremity edema and 1 to 2+ right lower extremity edema involving the ankles.  Distal pulses are 1+ and equal bilaterally.  Chest x-ray shows patchy right upper lobe and bilateral lower lobe opacities seen on prior CT.  Small effusion and cardiomegaly.  EKG shows sinus rhythm, rate 81.  Normal axis.  ST depression and T-wave inversion in 1, aVL, and V6 which is unchanged from old EKGs.  Hemoglobin 12.3, hematocrit 39.1, WBC 12.9, platelets 214.  Sodium 142, potassium 4.1, chloride 108, CO2 of 22, BUN 33, creatinine 1.3, glucose 148.  BNP [id number]. Troponin 6.59, calcium 9.6.  ASSESSMENT AND PLAN: 1. Non ST-segment elevation myocardial infarction /coronary artery     disease.  The patient presents with multiple comorbidities     including pneumonia and dyspnea in the setting of cardiomyopathy     and heart failure, and also intermittent left arm pain and found to     have an elevation of troponin to 6.59.  We will continue to cycle     his cardiac markers and add heparin.  Continue his aspirin, beta-     blocker, Plavix, ARB, and add statin therapy.  Plan right and left     heart cardiac catheterization once volume status/respiratory  status     improves; likely Friday. 2. Acute on chronic systolic congestive heart failure/ischemic     cardiomyopathy.  As above, dyspnea is likely multifactorial in the     setting of ongoing pneumonia.  He does have volume excess on exam.     We will place him on IV Lasix.  Continue beta-blocker, ACE     inhibitor, spironolactone and follow closely. 3. Persistent pneumonia.  The patient has been on multiple antibiotics     and is scheduled to see Dr. Delford Field in the office tomorrow.  We will     ask Pulmonology to see the patient in the a.m.  He is currently     afebrile with slight elevation of his white count to 12.9. 4. Hypertensive urgency.  Blood pressure is markedly elevated here in     the ED.  Follow closely with diuresis.  For the time being, we will     titrate his beta-blocker and also IV nitroglycerin with a goal     systolic pressure of less than 160 for now.  Diurese as above.  We     will consider renal artery duplex versus angiography given prior     history of left renal artery stenosis, status post stenting and     difficult to control blood pressure.  Follow creatinine. 5. Hyperlipidemia.  Add high-dose statin therapy in the setting of     acute coronary syndrome.  I will have to research as to why the     patient is not currently on statin at home. 6. Right greater than left lower extremity edema.  Check right upper     extremity ultrasound to rule out deep venous thrombosis. 7. Symptomatic premature ventricular contractions.  Continue beta-     blocker therapy. 8. Peripheral vascular disease.  The patient has history of left renal  artery stenting in the late 90s.  As above, with hypertension,     consider angiography at the time of catheterization versus Duplex     to rule out new renal artery stenosis.  His creatinine is at     baseline.     Nicolasa Ducking, ANP   ______________________________ [name], MD    CB/MEDQ  D:  03/30/2011  T:   03/31/2011  Job:  [id number]  Electronically Signed by Nicolasa Ducking ANP on 04/12/2011 04:18:13 PM Electronically Signed by [name] MD on 04/21/2011 02:32:39 PM

## 2011-04-21 NOTE — Telephone Encounter (Signed)
Addended by: Mylo Red F on: 04/21/2011 11:06 AM   Modules accepted: Orders

## 2011-04-21 NOTE — Telephone Encounter (Signed)
Pt is aware of Scott's recommendations and will return for lab work on 04/29/11 Mylo Red RN

## 2011-04-21 NOTE — Telephone Encounter (Signed)
Pt aware of lab results. He is feeling better today. 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  He says so far this is working out. Reassurance given. Mylo Red RN

## 2011-04-21 NOTE — Telephone Encounter (Signed)
Reviewed labs Creatinine stable. Since his volume is stable, I think we have to accept his creatinine running a little high. Continue current meds. Repeat BMET in 10 days to ensure stability. [name], PA-C

## 2011-04-29 ENCOUNTER — Telehealth: Payer: Self-pay | Admitting: *Deleted

## 2011-04-29 ENCOUNTER — Other Ambulatory Visit (INDEPENDENT_AMBULATORY_CARE_PROVIDER_SITE_OTHER): Payer: Medicare Other | Admitting: *Deleted

## 2011-04-29 DIAGNOSIS — I5023 Acute on chronic systolic (congestive) heart failure: Secondary | ICD-10-CM

## 2011-04-29 DIAGNOSIS — I10 Essential (primary) hypertension: Secondary | ICD-10-CM

## 2011-04-29 DIAGNOSIS — I5022 Chronic systolic (congestive) heart failure: Secondary | ICD-10-CM

## 2011-04-29 LAB — BASIC METABOLIC PANEL
BUN: 47 mg/dL — ABNORMAL HIGH (ref 6–23)
Calcium: 9.2 mg/dL (ref 8.4–10.5)
Creatinine, Ser: 1.5 mg/dL (ref 0.4–1.5)
GFR: 48.92 mL/min — ABNORMAL LOW (ref >60.00)
Glucose, Bld: 171 mg/dL — ABNORMAL HIGH (ref 70–99)

## 2011-04-29 NOTE — Telephone Encounter (Signed)
pt aware of lab results today and will come in monday 05/02/11 for repeat bmet. Danielle Rankin

## 2011-05-02 ENCOUNTER — Other Ambulatory Visit: Payer: Medicare Other | Admitting: *Deleted

## 2011-05-02 DIAGNOSIS — I5022 Chronic systolic (congestive) heart failure: Secondary | ICD-10-CM

## 2011-05-03 LAB — BASIC METABOLIC PANEL
Calcium: 9.1 mg/dL (ref 8.4–10.5)
Chloride: 107 mEq/L (ref 96–112)
Creatinine, Ser: 1.6 mg/dL — ABNORMAL HIGH (ref 0.4–1.5)
Sodium: 142 mEq/L (ref 135–145)

## 2011-05-06 ENCOUNTER — Ambulatory Visit (INDEPENDENT_AMBULATORY_CARE_PROVIDER_SITE_OTHER)
Admission: RE | Admit: 2011-05-06 | Discharge: 2011-05-06 | Disposition: A | Discharge status: Home / Self Care | Payer: Medicare Other | Source: Ambulatory Visit | Attending: Emergency Medicine | Admitting: Emergency Medicine

## 2011-05-06 ENCOUNTER — Encounter: Payer: Self-pay | Admitting: Emergency Medicine

## 2011-05-06 ENCOUNTER — Ambulatory Visit (INDEPENDENT_AMBULATORY_CARE_PROVIDER_SITE_OTHER): Payer: Medicare Other | Admitting: Emergency Medicine

## 2011-05-06 VITALS — BP 132/70 | HR 53 | Temp 98.0°F | Ht 66.0 in | Wt 175.4 lb

## 2011-05-06 DIAGNOSIS — R918 Other nonspecific abnormal finding of lung field: Secondary | ICD-10-CM

## 2011-05-06 DIAGNOSIS — R9389 Abnormal findings on diagnostic imaging of other specified body structures: Secondary | ICD-10-CM

## 2011-05-06 NOTE — Progress Notes (Signed)
Subjective:    Patient ID: Aaron [name], male    DOB: [birth year], 70 y.o.   MRN: [id number]  HPI 70 yo man, former smoker (20 pk-yrs), hx CAD/CABG, CHF, A Fib, DM. Admitted 10/12 for MI and pulm edema that was asymmetric  - concerning for possible PNA. HE was diuresed and treated w abx. He is much better, feels back to baseline. He has had imaging at [facility], [facility]. He has a hx of getting bronchitis whenever he gets URI.    Review of Systems  Constitutional: Negative for fever and unexpected weight change.  HENT: Positive for rhinorrhea, sneezing, postnasal drip and sinus pressure. Negative for ear pain, nosebleeds, congestion, sore throat, trouble swallowing and dental problem.   Eyes: Negative for redness and itching.  Respiratory: Negative for cough, chest tightness, shortness of breath and wheezing.   Cardiovascular: Positive for palpitations and leg swelling.  Gastrointestinal: Negative for nausea, vomiting and diarrhea.  Genitourinary: Negative for dysuria and urgency.  Musculoskeletal: Positive for arthralgias. Negative for joint swelling.  Skin: Negative for rash.  Neurological: Positive for headaches.  Hematological: Bruises/bleeds easily.  Psychiatric/Behavioral: Positive for dysphoric mood. The patient is nervous/anxious.     Past Medical History  Diagnosis Date  . Hyperlipidemia   . Hypertension   . CAD (coronary artery disease)     NSTEMI 11/11 -  cath: EF 40%, pLAD 30%, 90%, 30-40%, CFX stent occluded, pRCA 30%, dRCA stent ok, pPDA 40%, 30%;  s/p CABG 11/11;LHC 04/01/11: EF 30%, left RA with 30% ISR, right RA patent, RCA stent patent, severe disease in LAD and CFX, L-LAD, S-PDA, S-OM ok    . Prostate cancer     2006, s/p radical prostatectomy at [facility]  . Stroke     2007  . Injury of sigmoid colon     perforation requiring hemicolectomy and colostomy with subsequent takedown  . Renal artery stenosis     s/p prior ptca  . Chronic systolic heart  failure     echo 9/12: Mild LVH, EF 25%, restrictive diast dysfxn with elevated filling pressures, mild AI, mild enlarged Ao root 36 mm, mild MR, mod LAE, mild RVE, mod reduced RVSF, mild to mod PR, PASP 62-66 (mod pulmonary HTN), mod pleural effusion  . Ischemic cardiomyopathy   . Carotid stenosis     bilat 40-59% in 8/11  . Diabetes mellitus   . Atrial fibrillation   . Acute myocardial infarction, subendocardial infarction, subsequent episode of care   . Depression   . Gout   . Anxiety   . Osteoarthritis   . Pneumonia      Family History  Problem Relation Age of Onset  . Heart disease Mother   . Heart disease Father   . Heart disease Sister   . Diabetes Mother      History   Social History  . Marital Status: Married    Spouse Name: N/A    Number of Children: N/A  . Years of Education: N/A   Occupational History  . Not on file.   Social History Main Topics  . Smoking status: Former Smoker -- 2.0 packs/day for 20 years    Types: Cigarettes    Quit date: 02/08/1977  . Smokeless tobacco: Never Used  . Alcohol Use: No  . Drug Use: No  . Sexually Active: Not on file   Other Topics Concern  . Not on file   Social History Narrative   Pt lives in [place] with spouse.  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 [address], [place]     Allergies  Allergen Reactions  . Sulfa Drugs Cross Reactors Rash    Legs only.  . Sulfonamide Derivatives Rash    Legs only.     Outpatient Prescriptions Prior to Visit  Medication Sig Dispense Refill  . aspirin EC 81 MG tablet Take 81 mg by mouth daily.        . carvedilol (COREG) 12.5 MG tablet Take 18.75 mg by mouth 2 (two) times daily.        . cloNIDine (CATAPRES) 0.1 MG tablet Take 1 tablet (0.1 mg total) by mouth 2 (two) times daily.      Marland Kitchen Clopidogrel Bisulfate (PLAVIX PO) Take 75 mg by mouth daily.       . furosemide (LASIX) 40 MG tablet Take 80 mg by mouth daily.       Marland Kitchen guaiFENesin (MUCINEX) 600 MG 12 hr tablet Take 400 mg by  mouth 2 (two) times daily.       . hydrALAZINE (APRESOLINE) 25 MG tablet Take 0.5 tablets (12.5 mg total) by mouth 2 (two) times daily.      . isosorbide dinitrate (ISORDIL) 20 MG tablet Take 1 tablet by mouth Three times a day.      . lansoprazole (PREVACID) 30 MG capsule Take 30 mg by mouth daily.        . metFORMIN (GLUMETZA) 500 MG (MOD) 24 hr tablet Take 500 mg by mouth daily with breakfast.        . nitroGLYCERIN (NITROSTAT) 0.4 MG SL tablet Place 0.4 mg under the tongue every 5 (five) minutes as needed.        . potassium chloride SA (K-DUR,KLOR-CON) 20 MEQ tablet Take 20 mEq by mouth daily.        Marland Kitchen spironolactone (ALDACTONE) 25 MG tablet Take 1 tablet (25 mg total) by mouth daily.  30 tablet  11  . valsartan (DIOVAN) 320 MG tablet Take 320 mg by mouth daily.        . pravastatin (PRAVACHOL) 20 MG tablet Take 1 tablet (20 mg total) by mouth every evening.  30 tablet  11       Objective:   Physical Exam  Gen: Pleasant, well-nourished, in no distress,  normal affect  ENT: No lesions,  mouth clear,  oropharynx clear, no postnasal drip  Neck: No JVD, no TMG, no carotid bruits  Lungs: No use of accessory muscles, mild insp crackles mid R lung  Cardiovascular: RRR, 2/6 syst M, no gallop  Musculoskeletal: No deformities, no cyanosis or clubbing  Neuro: alert, non focal  Skin: Warm, no lesions or rashes     Assessment & Plan:  Abnormal CXR (chest x-ray) Was treated with serial abx without resolution - Drs Johney Frame and Wall dx him with pulm edema and now he is much better. The asymetric infiltrate on CXR now needs to be worked up - may be resolved if it was asymetric edema.  Repeat CXR today, may reflect scar vs asymmetric edema. I hear crackles on exam. May need CT scan to better assess.  ROV to discuss - he can cancel if the film is normal.

## 2011-05-06 NOTE — Assessment & Plan Note (Addendum)
Was treated with serial abx without resolution - Drs Johney Frame and Wall dx him with pulm edema and now he is much better. The asymetric infiltrate on CXR now needs to be worked up - may be resolved if it was asymetric edema.  Repeat CXR today, may reflect scar vs asymmetric edema. I hear crackles on exam. May need CT scan to better assess.  ROV to discuss - he can cancel if the film is normal.

## 2011-05-06 NOTE — Patient Instructions (Signed)
CXR today Follow up with Dr Delton Coombes to review next available opening

## 2011-05-10 ENCOUNTER — Telehealth: Payer: Self-pay | Admitting: Emergency Medicine

## 2011-05-10 NOTE — Telephone Encounter (Signed)
I spoke with pt and he is requesting his cxr results from 05/06/11. Please advise Dr. Delton Coombes, thanks

## 2011-05-11 NOTE — Telephone Encounter (Signed)
Please inform the patient that his CXR has cleared - no residual fluid, airspace disease or effusion on the new film. Thanks.

## 2011-05-11 NOTE — Telephone Encounter (Signed)
I spoke with patient about results and he verbalized understanding and had no questions 

## 2011-05-17 ENCOUNTER — Ambulatory Visit: Payer: Medicare Other | Admitting: Cardiology

## 2011-05-18 ENCOUNTER — Encounter: Payer: Self-pay | Admitting: Physician Assistant

## 2011-05-18 ENCOUNTER — Other Ambulatory Visit: Payer: Self-pay | Admitting: Cardiology

## 2011-05-18 MED ORDER — CARVEDILOL 12.5 MG PO TABS: 18.7500 mg | ORAL_TABLET | Freq: Two times a day (BID) | ORAL | Status: DC

## 2011-05-20 ENCOUNTER — Other Ambulatory Visit: Payer: Medicare Other | Admitting: *Deleted

## 2011-05-23 ENCOUNTER — Ambulatory Visit: Payer: Medicare Other | Admitting: Emergency Medicine

## 2011-05-24 ENCOUNTER — Other Ambulatory Visit (INDEPENDENT_AMBULATORY_CARE_PROVIDER_SITE_OTHER): Payer: Medicare Other | Admitting: *Deleted

## 2011-05-24 ENCOUNTER — Ambulatory Visit (INDEPENDENT_AMBULATORY_CARE_PROVIDER_SITE_OTHER): Payer: Medicare Other | Admitting: Cardiology

## 2011-05-24 ENCOUNTER — Ambulatory Visit (HOSPITAL_COMMUNITY): Payer: Medicare Other | Attending: Cardiology

## 2011-05-24 ENCOUNTER — Encounter: Payer: Self-pay | Admitting: Cardiology

## 2011-05-24 ENCOUNTER — Other Ambulatory Visit: Payer: Self-pay | Admitting: Cardiology

## 2011-05-24 VITALS — BP 162/60 | HR 60 | Ht 66.0 in | Wt 175.0 lb

## 2011-05-24 DIAGNOSIS — I2510 Atherosclerotic heart disease of native coronary artery without angina pectoris: Secondary | ICD-10-CM

## 2011-05-24 DIAGNOSIS — I701 Atherosclerosis of renal artery: Secondary | ICD-10-CM

## 2011-05-24 DIAGNOSIS — I10 Essential (primary) hypertension: Secondary | ICD-10-CM

## 2011-05-24 DIAGNOSIS — I379 Nonrheumatic pulmonary valve disorder, unspecified: Secondary | ICD-10-CM | POA: Insufficient documentation

## 2011-05-24 DIAGNOSIS — R609 Edema, unspecified: Secondary | ICD-10-CM | POA: Insufficient documentation

## 2011-05-24 DIAGNOSIS — I5022 Chronic systolic (congestive) heart failure: Secondary | ICD-10-CM

## 2011-05-24 DIAGNOSIS — I359 Nonrheumatic aortic valve disorder, unspecified: Secondary | ICD-10-CM | POA: Insufficient documentation

## 2011-05-24 DIAGNOSIS — I252 Old myocardial infarction: Secondary | ICD-10-CM | POA: Insufficient documentation

## 2011-05-24 DIAGNOSIS — I509 Heart failure, unspecified: Secondary | ICD-10-CM | POA: Insufficient documentation

## 2011-05-24 DIAGNOSIS — I079 Rheumatic tricuspid valve disease, unspecified: Secondary | ICD-10-CM | POA: Insufficient documentation

## 2011-05-24 DIAGNOSIS — Z8673 Personal history of transient ischemic attack (TIA), and cerebral infarction without residual deficits: Secondary | ICD-10-CM | POA: Insufficient documentation

## 2011-05-24 DIAGNOSIS — E119 Type 2 diabetes mellitus without complications: Secondary | ICD-10-CM | POA: Insufficient documentation

## 2011-05-24 LAB — HEPATIC FUNCTION PANEL
ALT: 18 U/L (ref 0–53)
Alkaline Phosphatase: 75 U/L (ref 39–117)
Bilirubin, Direct: 0.1 mg/dL (ref 0.0–0.3)
Total Bilirubin: 0.9 mg/dL (ref 0.3–1.2)

## 2011-05-24 LAB — LIPID PANEL
HDL: 33.9 mg/dL — ABNORMAL LOW (ref >39.00)
Total CHOL/HDL Ratio: 6
Triglycerides: 292 mg/dL — ABNORMAL HIGH (ref 0.0–149.0)

## 2011-05-24 LAB — BASIC METABOLIC PANEL
Calcium: 9.7 mg/dL (ref 8.4–10.5)
GFR: 38.69 mL/min — ABNORMAL LOW (ref >60.00)
Sodium: 142 mEq/L (ref 135–145)

## 2011-05-24 NOTE — Assessment & Plan Note (Signed)
Much improved. No change in treatment.

## 2011-05-24 NOTE — Assessment & Plan Note (Signed)
Stable. No change in treatment. Last stress Myoview and catheterization this fall was stable. Continue medical therapy.

## 2011-05-24 NOTE — Progress Notes (Signed)
HPI Mr. Aaron [name] returns today for close followup of his chronic systolic heart failure. He had a recent non-ST segment elevation MI with pulmonary edema.  His last echocardiogram in September showed ejection fraction 25% which has declined significantly over the past year.  Much of this is due to hypertension and also ischemic cardiomyopathy. His blood pressure is now under much better control running around 130/60. His last blood work was stable.  His last stress Myoview was in October which showed no ischemia but several small fixed defects in the inferior lateral Mickey Hebel and moderate anterolateral defects.  Meds reviewed and he is very compliant.  He denies orthopnea, PND his edema has improved. He is having no angina. He denies any presyncope or syncope. He still having palpitations. He has a followup scheduled with Dr. Johney Frame 17 December.  Past Medical History  Diagnosis Date  . Hyperlipidemia   . Hypertension   . CAD (coronary artery disease)     NSTEMI 11/11 -  cath: EF 40%, pLAD 30%, 90%, 30-40%, CFX stent occluded, pRCA 30%, dRCA stent ok, pPDA 40%, 30%;  s/p CABG 11/11;LHC 04/01/11: EF 30%, left RA with 30% ISR, right RA patent, RCA stent patent, severe disease in LAD and CFX, L-LAD, S-PDA, S-OM ok    . Prostate cancer     2006, s/p radical prostatectomy at [facility]  . Stroke     2007  . Injury of sigmoid colon     perforation requiring hemicolectomy and colostomy with subsequent takedown  . Renal artery stenosis     s/p prior ptca  . Chronic systolic heart failure     echo 9/12: Mild LVH, EF 25%, restrictive diast dysfxn with elevated filling pressures, mild AI, mild enlarged Ao root 36 mm, mild MR, mod LAE, mild RVE, mod reduced RVSF, mild to mod PR, PASP 62-66 (mod pulmonary HTN), mod pleural effusion  . Ischemic cardiomyopathy   . Carotid stenosis     bilat 40-59% in 8/11  . Diabetes mellitus   . Atrial fibrillation   . Acute myocardial infarction, subendocardial  infarction, subsequent episode of care   . Depression   . Gout   . Anxiety   . Osteoarthritis   . Pneumonia     Current Outpatient Prescriptions  Medication Sig Dispense Refill  . aspirin EC 81 MG tablet Take 81 mg by mouth daily.        . carvedilol (COREG) 12.5 MG tablet Take 1.5 tablets (18.75 mg total) by mouth 2 (two) times daily.  90 tablet  6  . cloNIDine (CATAPRES) 0.1 MG tablet Take 1 tablet (0.1 mg total) by mouth 2 (two) times daily.      Marland Kitchen Clopidogrel Bisulfate (PLAVIX PO) Take 75 mg by mouth daily.       . furosemide (LASIX) 40 MG tablet Take 80 mg by mouth daily.       Marland Kitchen guaiFENesin (MUCINEX) 600 MG 12 hr tablet Take 400 mg by mouth 2 (two) times daily.       . hydrALAZINE (APRESOLINE) 25 MG tablet Take 0.5 tablets (12.5 mg total) by mouth 2 (two) times daily.      . isosorbide dinitrate (ISORDIL) 20 MG tablet Take 1 tablet by mouth Three times a day.      . lansoprazole (PREVACID) 30 MG capsule Take 30 mg by mouth daily.        . metFORMIN (GLUMETZA) 500 MG (MOD) 24 hr tablet Take 500 mg by mouth daily with breakfast.        .  nitroGLYCERIN (NITROSTAT) 0.4 MG SL tablet Place 0.4 mg under the tongue every 5 (five) minutes as needed.        . potassium chloride SA (K-DUR,KLOR-CON) 20 MEQ tablet Take 20 mEq by mouth daily.        Marland Kitchen spironolactone (ALDACTONE) 25 MG tablet Take 1 tablet (25 mg total) by mouth daily.  30 tablet  11  . valsartan (DIOVAN) 320 MG tablet Take 320 mg by mouth daily.        . pravastatin (PRAVACHOL) 20 MG tablet Take 1 tablet (20 mg total) by mouth every evening.  30 tablet  11  . DISCONTD: carvedilol (COREG) 12.5 MG tablet Take 1 tablet (12.5 mg total) by mouth 2 (two) times daily.  60 tablet  11    Allergies  Allergen Reactions  . Sulfa Drugs Cross Reactors Rash    Legs only.  . Sulfonamide Derivatives Rash    Legs only.    Family History  Problem Relation Age of Onset  . Heart disease Mother   . Heart disease Father   . Heart disease  Sister   . Diabetes Mother     History   Social History  . Marital Status: Married    Spouse Name: N/A    Number of Children: N/A  . Years of Education: N/A   Occupational History  . Not on file.   Social History Main Topics  . Smoking status: Former Smoker -- 2.0 packs/day for 20 years    Types: Cigarettes    Quit date: 02/08/1977  . Smokeless tobacco: Never Used  . Alcohol Use: No  . Drug Use: No  . Sexually Active: Not on file   Other Topics Concern  . Not on file   Social History Narrative   Pt lives in [place] with spouse.  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[employer], [place]    ROS ALL NEGATIVE EXCEPT THOSE NOTED IN HPI  PE  General Appearance: well developed, well nourished in no acute distress HEENT: symmetrical face, PERRLA, good dentition  Neck: no JVD, thyromegaly, or adenopathy, trachea midline Chest: symmetric without deformity Cardiac: PMI non-displaced, RRR, normal S1, S2, no gallop, Soft systolic murmur at the apex Lung: clear to ausculation and percussion Vascular: all pulses full without bruits  Abdominal: nondistended, nontender, good bowel sounds, no HSM, no bruits Extremities: no cyanosis, clubbing, 1+ edemano sign of DVT, no varicosities  Skin: normal color, no rashes Neuro: alert and oriented x 3, non-focal Pysch: normal affect  EKG  BMET    Component Value Date/Time   NA 142 05/02/2011 1212   K 4.4 05/02/2011 1212   CL 107 05/02/2011 1212   CO2 24 05/02/2011 1212   GLUCOSE 142* 05/02/2011 1212   BUN 45* 05/02/2011 1212   CREATININE 1.6* 05/02/2011 1212   CALCIUM 9.1 05/02/2011 1212   GFRNONAA 42* 04/03/2011 0535   GFRAA 49* 04/03/2011 0535    Lipid Panel     Component Value Date/Time   CHOL 183 03/31/2011 0238   TRIG 156* 03/31/2011 0238   HDL 27* 03/31/2011 0238   CHOLHDL 6.8 03/31/2011 0238   VLDL 31 03/31/2011 0238   LDLCALC 125* 03/31/2011 0238    CBC    Component Value Date/Time   WBC 8.8 04/03/2011 0535    RBC 4.36 04/03/2011 0535   HGB 10.2* 04/03/2011 0535   HCT 33.6* 04/03/2011 0535   PLT 193 04/03/2011 0535   MCV 77.1* 04/03/2011 0535   MCH 23.4* 04/03/2011 0535  MCHC 30.4 04/03/2011 0535   RDW 15.8* 04/03/2011 0535   LYMPHSABS 2.0 03/30/2011 1437   MONOABS 0.9 03/30/2011 1437   EOSABS 0.1 03/30/2011 1437   BASOSABS 0.0 03/30/2011 1437

## 2011-05-24 NOTE — Assessment & Plan Note (Signed)
Improved. No change in heart failure protocol.

## 2011-05-24 NOTE — Assessment & Plan Note (Signed)
Creatinine is stable and blood pressure improved.

## 2011-05-24 NOTE — Patient Instructions (Signed)
Your physician has requested that you have an echocardiogram. Echocardiography is a painless test that uses sound waves to create images of your heart. It provides your doctor with information about the size and shape of your heart and how well your heart's chambers and valves are working. This procedure takes approximately one hour. There are no restrictions for this procedure. Today December 4,2012  Your physician wants you to follow-up in: 3 months with Dr. Daleen Squibb. You will receive a reminder letter in the mail two months in advance. If you don't receive a letter, please call our office to schedule the follow-up appointment.

## 2011-05-24 NOTE — Assessment & Plan Note (Signed)
Stable with weight remaining between 169 and 172. He will continue current medications and follow heart failure protocol. His blood pressure is under much better control at home and I hope that he's received some recovery of left ventricular systolic function. Will repeat echocardiogram prior to following up with Dr. Johney Frame on the 17th. He may need a defibrillator.

## 2011-06-06 ENCOUNTER — Ambulatory Visit: Payer: Medicare Other | Admitting: Internal Medicine

## 2011-06-09 ENCOUNTER — Encounter: Payer: Self-pay | Admitting: Internal Medicine

## 2011-06-09 ENCOUNTER — Ambulatory Visit (INDEPENDENT_AMBULATORY_CARE_PROVIDER_SITE_OTHER): Payer: Medicare Other | Admitting: Internal Medicine

## 2011-06-09 VITALS — BP 118/52 | HR 54 | Resp 18 | Ht 66.0 in | Wt 178.4 lb

## 2011-06-09 DIAGNOSIS — I493 Ventricular premature depolarization: Secondary | ICD-10-CM

## 2011-06-09 DIAGNOSIS — I10 Essential (primary) hypertension: Secondary | ICD-10-CM

## 2011-06-09 DIAGNOSIS — I4949 Other premature depolarization: Secondary | ICD-10-CM

## 2011-06-09 DIAGNOSIS — I4891 Unspecified atrial fibrillation: Secondary | ICD-10-CM

## 2011-06-09 DIAGNOSIS — I5022 Chronic systolic (congestive) heart failure: Secondary | ICD-10-CM

## 2011-06-09 NOTE — Assessment & Plan Note (Signed)
euvolemic on exam Continue medical therapy EF has improved to 40-45%

## 2011-06-09 NOTE — Patient Instructions (Signed)
Your physician wants you to follow-up in: 4 months with Dr Allred You will receive a reminder letter in the mail two months in advance. If you don't receive a letter, please call our office to schedule the follow-up appointment.  

## 2011-06-09 NOTE — Progress Notes (Signed)
PCP:  Paulina Fusi, MD Primary Cardiologist:  Dr Daleen Squibb  The patient presents today for electrophysiology followup.  Since last being seen in our clinic, the patient has had difficulty with CHF.  Fortunately, his EF has improved to 40-45% with medical therapy.  He continues to have PVCs.  His SOB is much improved.  Today, he denies symptoms of chest pain, orthopnea, PND, lower extremity edema, dizziness, presyncope, syncope, or neurologic sequela.  The patient feels that he is tolerating medications without difficulties and is otherwise without complaint today.   Past Medical History  Diagnosis Date  . Hyperlipidemia   . Hypertension   . CAD (coronary artery disease)     NSTEMI 11/11 -  cath: EF 40%, pLAD 30%, 90%, 30-40%, CFX stent occluded, pRCA 30%, dRCA stent ok, pPDA 40%, 30%;  s/p CABG 11/11;LHC 04/01/11: EF 30%, left RA with 30% ISR, right RA patent, RCA stent patent, severe disease in LAD and CFX, L-LAD, S-PDA, S-OM ok    . Prostate cancer     2006, s/p radical prostatectomy at [facility]  . Stroke     2007  . Injury of sigmoid colon     perforation requiring hemicolectomy and colostomy with subsequent takedown  . Renal artery stenosis     s/p prior ptca  . Chronic systolic heart failure     echo 9/12: Mild LVH, EF 25%, restrictive diast dysfxn with elevated filling pressures, mild AI, mild enlarged Ao root 36 mm, mild MR, mod LAE, mild RVE, mod reduced RVSF, mild to mod PR, PASP 62-66 (mod pulmonary HTN), mod pleural effusion  . Ischemic cardiomyopathy   . Carotid stenosis     bilat 40-59% in 8/11  . Diabetes mellitus   . Atrial fibrillation   . Acute myocardial infarction, subendocardial infarction, subsequent episode of care   . Depression   . Gout   . Anxiety   . Osteoarthritis   . PVC's (premature ventricular contractions)     RBB R superior axis PVCs frequently in bigeminy   Past Surgical History  Procedure Date  . Colon surgery   . Prostate surgery   .  Coronary artery bypass graft     Current Outpatient Prescriptions  Medication Sig Dispense Refill  . aspirin EC 81 MG tablet Take 81 mg by mouth daily.        . carvedilol (COREG) 12.5 MG tablet Take 1.5 tablets (18.75 mg total) by mouth 2 (two) times daily.  90 tablet  6  . cloNIDine (CATAPRES) 0.1 MG tablet Take 1 tablet (0.1 mg total) by mouth 2 (two) times daily.      Marland Kitchen Clopidogrel Bisulfate (PLAVIX PO) Take 75 mg by mouth daily.       . furosemide (LASIX) 40 MG tablet Take 80 mg by mouth daily.       Marland Kitchen guaiFENesin (MUCINEX) 600 MG 12 hr tablet Take 400 mg by mouth 2 (two) times daily.       . hydrALAZINE (APRESOLINE) 25 MG tablet Take 0.5 tablets (12.5 mg total) by mouth 2 (two) times daily.      . isosorbide dinitrate (ISORDIL) 20 MG tablet Take 1 tablet by mouth Three times a day.      . lansoprazole (PREVACID) 30 MG capsule Take 30 mg by mouth daily.        . metFORMIN (GLUMETZA) 500 MG (MOD) 24 hr tablet Take 500 mg by mouth daily with breakfast.        . nitroGLYCERIN (NITROSTAT)  0.4 MG SL tablet Place 0.4 mg under the tongue every 5 (five) minutes as needed.        . potassium chloride SA (K-DUR,KLOR-CON) 20 MEQ tablet Take 20 mEq by mouth daily.        . pravastatin (PRAVACHOL) 20 MG tablet Take 1 tablet (20 mg total) by mouth every evening.  30 tablet  11  . spironolactone (ALDACTONE) 25 MG tablet Take 1 tablet (25 mg total) by mouth daily.  30 tablet  11  . valsartan (DIOVAN) 320 MG tablet Take 320 mg by mouth daily. TAKE 1/2 TABLET BY MOUTH DAILY.      Marland Kitchen DISCONTD: carvedilol (COREG) 12.5 MG tablet Take 1 tablet (12.5 mg total) by mouth 2 (two) times daily.  60 tablet  11    Allergies  Allergen Reactions  . Sulfa Drugs Cross Reactors Rash    Legs only.  . Sulfonamide Derivatives Rash    Legs only.    History   Social History  . Marital Status: Married    Spouse Name: N/A    Number of Children: N/A  . Years of Education: N/A   Occupational History  . Not on file.     Social History Main Topics  . Smoking status: Former Smoker -- 2.0 packs/day for 20 years    Types: Cigarettes    Quit date: 02/08/1977  . Smokeless tobacco: Never Used  . Alcohol Use: No  . Drug Use: No  . Sexually Active: Not on file   Other Topics Concern  . Not on file   Social History Narrative   Pt lives in [place] with spouse.  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 [address], [place]    Family History  Problem Relation Age of Onset  . Heart disease Mother   . Heart disease Father   . Heart disease Sister   . Diabetes Mother     ROS-  All systems are reviewed and are negative except as outlined in the HPI above   Physical Exam: Filed Vitals:   06/09/11 1235  BP: 118/52  Pulse: 54  Resp: 18  Height: 5\' 6"  (1.676 m)  Weight: 178 lb 6.4 oz (80.922 kg)    GEN- The patient is well appearing, alert and oriented x 3 today.   Head- normocephalic, atraumatic Eyes-  Sclera clear, conjunctiva pink Ears- hearing intact Oropharynx- clear Neck- supple, no JVP Lymph- no cervical lymphadenopathy Lungs- Clear to ausculation bilaterally, normal work of breathing Heart- Regular rate and rhythm with ectopy in a bigeminal pattern,  GI- soft, NT, ND, + BS Extremities- no clubbing, cyanosis, trace edema MS- no significant deformity or atrophy Skin- no rash or lesion Psych- euthymic mood, full affect Neuro- strength and sensation are intact  ekg today reveals sinus bradycardia, PVCs in a RBB R superior axis  Assessment and Plan:

## 2011-06-09 NOTE — Assessment & Plan Note (Signed)
Aaron [name] continues to have PVCs in a bigeminal pattern.  The PVCs are RBB Right Superior axis, likely arising from the inferolateral LV. Continue coreg. Therapeutic strategies for PVCs including medicine and ablation were discussed in detail with the patient today.  I think that he would be a good candidate for ablation long term. Presently, the patient would like to continue his present medicine regimen and recover from his recent episode of CHF. It could be that his EF would improve with treatment of his PVCs.

## 2011-06-09 NOTE — Assessment & Plan Note (Signed)
No changes today

## 2011-07-28 ENCOUNTER — Telehealth: Payer: Self-pay | Admitting: Internal Medicine

## 2011-07-28 NOTE — Telephone Encounter (Signed)
07/28/11--pt calling stating his feet hurt and should he take ASA or Tylenol--advised to take tylenol--asked if he needed an appoint, but pt states he has one in march--nt

## 2011-07-28 NOTE — Telephone Encounter (Signed)
New msg: pt calling wanting to speak with nurse regarding pt taking medication for foot pain. Pt wants to know should he take tylenol or aspirin. Please return pt call to discuss further.

## 2011-08-19 ENCOUNTER — Other Ambulatory Visit: Payer: Self-pay | Admitting: Cardiology

## 2011-08-19 MED ORDER — CLOPIDOGREL BISULFATE 75 MG PO TABS: 75.0000 mg | ORAL_TABLET | Freq: Every day | ORAL | Status: DC

## 2011-08-19 NOTE — Telephone Encounter (Signed)
Received request from [facility] Regarding Plavix 75mg  supply  Order called in to [facility] today. Mylo Red RN

## 2011-08-24 ENCOUNTER — Encounter: Payer: Self-pay | Admitting: Cardiology

## 2011-08-24 ENCOUNTER — Telehealth: Payer: Self-pay | Admitting: *Deleted

## 2011-08-24 ENCOUNTER — Ambulatory Visit (INDEPENDENT_AMBULATORY_CARE_PROVIDER_SITE_OTHER): Payer: Medicare Other | Admitting: Cardiology

## 2011-08-24 VITALS — BP 174/71 | HR 47 | Ht 66.0 in | Wt 174.0 lb

## 2011-08-24 DIAGNOSIS — I2510 Atherosclerotic heart disease of native coronary artery without angina pectoris: Secondary | ICD-10-CM

## 2011-08-24 DIAGNOSIS — I701 Atherosclerosis of renal artery: Secondary | ICD-10-CM

## 2011-08-24 DIAGNOSIS — I4891 Unspecified atrial fibrillation: Secondary | ICD-10-CM

## 2011-08-24 DIAGNOSIS — I5022 Chronic systolic (congestive) heart failure: Secondary | ICD-10-CM

## 2011-08-24 DIAGNOSIS — I10 Essential (primary) hypertension: Secondary | ICD-10-CM

## 2011-08-24 DIAGNOSIS — I6529 Occlusion and stenosis of unspecified carotid artery: Secondary | ICD-10-CM

## 2011-08-24 DIAGNOSIS — E785 Hyperlipidemia, unspecified: Secondary | ICD-10-CM

## 2011-08-24 MED ORDER — HYDRALAZINE HCL 25 MG PO TABS: 25.0000 mg | ORAL_TABLET | Freq: Three times a day (TID) | ORAL | Status: DC

## 2011-08-24 MED ORDER — ISOSORBIDE DINITRATE 20 MG PO TABS: 20.0000 mg | ORAL_TABLET | Freq: Three times a day (TID) | ORAL | Status: DC

## 2011-08-24 NOTE — Assessment & Plan Note (Signed)
Not well controlled. Will increase hydralazine 25 mg t.i.d. Which may have to be increased further to 50 t.i.d.

## 2011-08-24 NOTE — Progress Notes (Signed)
Addended by: Lisabeth Devoid F on: 08/24/2011 02:05 PM   Modules accepted: Orders

## 2011-08-24 NOTE — Patient Instructions (Addendum)
Your physician has requested that you have a carotid duplex. This test is an ultrasound of the carotid arteries in your neck. It looks at blood flow through these arteries that supply the brain with blood. Allow one hour for this exam. There are no restrictions or special instructions.  A follow-up appointment with Dr. Johney Frame has been scheduled for you on 09/15/11 at 4:15pm.  Please arrive  Prior to your appointment for check-in.  Your physician wants you to follow-up in: 6 months with Dr. Daleen Squibb. You will receive a reminder letter in the mail two months in advance. If you don't receive a letter, please call our office to schedule the follow-up appointment.  Your physician recommends that you continue on your current medications as directed. Please refer to the Current Medication list given to you today.  Your physician has recommended you make the following change in your medication: Increase Hydralazine to 1 tablet three times a day

## 2011-08-24 NOTE — Progress Notes (Signed)
HPI Aaron [name] comes in today for evaluation and management of this complex cardiac and vascular history.  He was recently evaluated by Dr. Tomasa Blase primary care. His potassium was elevated at 5.5 and his creatinine increased to 2.69. He discontinued his potassium, spironolactone, and metformin. His followup blood work showed a creatinine dropping to 0.15 potassium 4.2. His hemoglobin A1c was 6.9%. Triglycerides were controlled 463. Total cholesterol was 188, LDL cholesterol could not be measured, and his HDL was 26.  He is now on hydralazine and isosorbide for a pressure.  He is not followed up with Dr. Johney Frame.  He denies any orthopnea, PND or edema. He has also had no palpitations, presyncope. He denies angina.  Past Medical History  Diagnosis Date  . Hyperlipidemia   . Hypertension   . CAD (coronary artery disease)     NSTEMI 11/11 -  cath: EF 40%, pLAD 30%, 90%, 30-40%, CFX stent occluded, pRCA 30%, dRCA stent ok, pPDA 40%, 30%;  s/p CABG 11/11;LHC 04/01/11: EF 30%, left RA with 30% ISR, right RA patent, RCA stent patent, severe disease in LAD and CFX, L-LAD, S-PDA, S-OM ok    . Prostate cancer     2006, s/p radical prostatectomy at [facility]  . Stroke     2007  . Injury of sigmoid colon     perforation requiring hemicolectomy and colostomy with subsequent takedown  . Renal artery stenosis     s/p prior ptca  . Chronic systolic heart failure     echo 9/12: Mild LVH, EF 25%, restrictive diast dysfxn with elevated filling pressures, mild AI, mild enlarged Ao root 36 mm, mild MR, mod LAE, mild RVE, mod reduced RVSF, mild to mod PR, PASP 62-66 (mod pulmonary HTN), mod pleural effusion  . Ischemic cardiomyopathy   . Carotid stenosis     bilat 40-59% in 8/11  . Diabetes mellitus   . Atrial fibrillation   . Acute myocardial infarction, subendocardial infarction, subsequent episode of care   . Depression   . Gout   . Anxiety   . Osteoarthritis   . PVC's (premature ventricular  contractions)     RBB R superior axis PVCs frequently in bigeminy    Current Outpatient Prescriptions  Medication Sig Dispense Refill  . aspirin EC 81 MG tablet Take 81 mg by mouth daily.        . carvedilol (COREG) 12.5 MG tablet Take 1.5 tablets (18.75 mg total) by mouth 2 (two) times daily.  90 tablet  6  . cloNIDine (CATAPRES) 0.1 MG tablet Take 1 tablet (0.1 mg total) by mouth 2 (two) times daily.      . clopidogrel (PLAVIX) 75 MG tablet Take 1 tablet (75 mg total) by mouth daily.  90 tablet  3  . febuxostat (ULORIC) 40 MG tablet Take 80 mg by mouth daily.      . furosemide (LASIX) 40 MG tablet Take 80 mg by mouth daily.       Marland Kitchen guaiFENesin (MUCINEX) 600 MG 12 hr tablet Take 400 mg by mouth 2 (two) times daily.       . hydrALAZINE (APRESOLINE) 25 MG tablet Take 0.5 tablets (12.5 mg total) by mouth 2 (two) times daily.      . isosorbide dinitrate (ISORDIL) 20 MG tablet Take 1 tablet (20 mg total) by mouth 3 (three) times daily.  270 tablet  3  . lansoprazole (PREVACID) 30 MG capsule Take 30 mg by mouth daily.        Marland Kitchen  nitroGLYCERIN (NITROSTAT) 0.4 MG SL tablet Place 0.4 mg under the tongue every 5 (five) minutes as needed.        . valsartan (DIOVAN) 320 MG tablet Take 320 mg by mouth daily. TAKE 1/2 TABLET BY MOUTH DAILY.      Marland Kitchen DISCONTD: carvedilol (COREG) 12.5 MG tablet Take 1 tablet (12.5 mg total) by mouth 2 (two) times daily.  60 tablet  11    Allergies  Allergen Reactions  . Sulfa Drugs Cross Reactors Rash    Legs only.  . Sulfonamide Derivatives Rash    Legs only.    Family History  Problem Relation Age of Onset  . Heart disease Mother   . Heart disease Father   . Heart disease Sister   . Diabetes Mother     History   Social History  . Marital Status: Married    Spouse Name: N/A    Number of Children: N/A  . Years of Education: N/A   Occupational History  . Not on file.   Social History Main Topics  . Smoking status: Former Smoker -- 2.0 packs/day for 20  years    Types: Cigarettes    Quit date: 02/08/1977  . Smokeless tobacco: Never Used  . Alcohol Use: No  . Drug Use: No  . Sexually Active: Not on file   Other Topics Concern  . Not on file   Social History Narrative   Pt lives in [place] with spouse.  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[place] [employer], [place]    ROS ALL NEGATIVE EXCEPT THOSE NOTED IN HPI  PE  General Appearance: well developed, well nourished in no acute distress HEENT: symmetrical face, PERRLA, good dentition  Neck: no JVD, thyromegaly, or adenopathy, trachea midline Chest: symmetric without deformity Cardiac: PMI non-displaced, RRR, normal S1, S2, no gallop or murmur Lung: clear to ausculation and percussion Vascular: all pulses full without bruits  Abdominal: nondistended, nontender, good bowel sounds, no HSM, no bruits Extremities: no cyanosis, clubbing or edema, no sign of DVT, no varicosities  Skin: normal color, no rashes Neuro: alert and oriented x 3, non-focal Pysch: normal affect  EKG  BMET    Component Value Date/Time   NA 142 05/24/2011 0938   K 4.7 05/24/2011 0938   CL 102 05/24/2011 0938   CO2 25 05/24/2011 0938   GLUCOSE 137* 05/24/2011 0938   BUN 46* 05/24/2011 0938   CREATININE 1.9* 05/24/2011 0938   CALCIUM 9.7 05/24/2011 0938   GFRNONAA 42* 04/03/2011 0535   GFRAA 49* 04/03/2011 0535    Lipid Panel     Component Value Date/Time   CHOL 210* 05/24/2011 0938   TRIG 292.0* 05/24/2011 0938   HDL 33.90* 05/24/2011 0938   CHOLHDL 6 05/24/2011 0938   VLDL 58.4* 05/24/2011 0938   LDLCALC 125* 03/31/2011 0238    CBC    Component Value Date/Time   WBC 8.8 04/03/2011 0535   RBC 4.36 04/03/2011 0535   HGB 10.2* 04/03/2011 0535   HCT 33.6* 04/03/2011 0535   PLT 193 04/03/2011 0535   MCV 77.1* 04/03/2011 0535   MCH 23.4* 04/03/2011 0535   MCHC 30.4 04/03/2011 0535   RDW 15.8* 04/03/2011 0535   LYMPHSABS 2.0 03/30/2011 1437   MONOABS 0.9 03/30/2011 1437   EOSABS 0.1 03/30/2011 1437    BASOSABS 0.0 03/30/2011 1437

## 2011-08-24 NOTE — Assessment & Plan Note (Signed)
Carotid Dopplers are due. We'll schedule

## 2011-08-24 NOTE — Assessment & Plan Note (Signed)
With an adequate blood pressure control, and ejection fraction 25% on last echocardiogram, will increase his hydralazine to 25 mg 3 times a day. With his kidney function deteriorating with a recent creatinine of 2.15, his spironolactone was discontinued appropriately. He is still on an ARB Will follow blood work scheduled with Dr. Tomasa Blase of primary care. This may have to be stopped as well depending on his potassium levels.  I've arranged for him to see Dr. Johney Frame for reduced LV function, history of A. Fib, for possibility of ablation and/or defibrillator.

## 2011-08-24 NOTE — Telephone Encounter (Signed)
I spoke with pt and he is aware Dr. Daleen Squibb reviewed bp's.  Increased to hydralazine to 25 mg three times a day. He will call back if he needs a new prescription. Mylo Red RN

## 2011-08-24 NOTE — Assessment & Plan Note (Signed)
He has had a slow progressive increase in his creatinine a nd his blood pressures never been well controlled even after intervention. Do not think he needs reevaluation for this.

## 2011-08-30 ENCOUNTER — Encounter (INDEPENDENT_AMBULATORY_CARE_PROVIDER_SITE_OTHER): Payer: Medicare Other

## 2011-08-30 DIAGNOSIS — I6529 Occlusion and stenosis of unspecified carotid artery: Secondary | ICD-10-CM

## 2011-08-31 ENCOUNTER — Telehealth: Payer: Self-pay | Admitting: Cardiology

## 2011-08-31 NOTE — Telephone Encounter (Signed)
Dr Arlys John downs, [facility], requesting surgical clearance for cancer removal, pls call [phone] fax [phone]

## 2011-08-31 NOTE — Telephone Encounter (Signed)
Dr Daleen Squibb please advise; do you need to see this pt prior to clearance? If no appointment is needed would he be at low/mod or high risk and can he hold both asa and plavix? Please advise and I can write the letter and send. Thank you.

## 2011-09-01 NOTE — Telephone Encounter (Signed)
No

## 2011-09-02 ENCOUNTER — Telehealth: Payer: Self-pay | Admitting: *Deleted

## 2011-09-02 ENCOUNTER — Encounter: Payer: Self-pay | Admitting: *Deleted

## 2011-09-02 NOTE — Telephone Encounter (Signed)
Clearance note faxed to dr downs at [phone].

## 2011-09-02 NOTE — Telephone Encounter (Signed)
Pt needs letter by Tuesday in order to get surgery done asap, pt requesting call back today to let him know when and how we will be sending letter, pls call [phone]

## 2011-09-02 NOTE — Telephone Encounter (Signed)
Message copied by Freddi Starr on Fri Sep 02, 2011  4:16 PM ------      Message from: [name]      Created: Wed Aug 31, 2011  9:43 PM       This should be a low risk surgical procedure.  He has recently seen Dr Daleen Squibb.  I think that he can be cleared by Dr Daleen Squibb if he is agreeable.            Thanks      ----- Message -----         From: Deliah Goody, RN         Sent: 08/31/2011  12:45 PM           To: [name], MD            When i talked with dr downs office they are seeing the pt back on the 19th and would like to have clearance by then. The pt is scheduled to see you 09-15-11 for a follow up. Do you need to see or can we clear him? thanks      ----- Message -----         From: Gaylord Shih, MD         Sent: 08/30/2011   9:53 AM           To: Deliah Goody, RN            No, but we can clear him for that.      ----- Message -----         From: Deliah Goody, RN         Sent: 08/29/2011   4:59 PM           To: Gaylord Shih, MD            Dr Jens Som got a dod call on Monday 08-29-11 from dr brian downs from [facility]. The pt is needing surgical clearance for removal of merkle on his scalp. He is seeing dr Johney Frame on the 28th for clearance and did not know if you needed to see or not? thanks

## 2011-09-02 NOTE — Telephone Encounter (Signed)
Dr. Daleen Squibb, what about level of risk and holding Plavix and ASA??

## 2011-09-07 NOTE — Telephone Encounter (Signed)
Low risk. Can hold temporarily.

## 2011-09-13 ENCOUNTER — Encounter: Payer: Self-pay | Admitting: Cardiology

## 2011-09-15 ENCOUNTER — Ambulatory Visit (INDEPENDENT_AMBULATORY_CARE_PROVIDER_SITE_OTHER): Payer: Medicare Other | Admitting: Internal Medicine

## 2011-09-15 ENCOUNTER — Encounter: Payer: Self-pay | Admitting: Internal Medicine

## 2011-09-15 VITALS — BP 140/60 | HR 55 | Wt 177.5 lb

## 2011-09-15 DIAGNOSIS — I4949 Other premature depolarization: Secondary | ICD-10-CM

## 2011-09-15 DIAGNOSIS — I493 Ventricular premature depolarization: Secondary | ICD-10-CM

## 2011-09-15 DIAGNOSIS — Z01810 Encounter for preprocedural cardiovascular examination: Secondary | ICD-10-CM

## 2011-09-15 NOTE — Progress Notes (Signed)
PCP:  Paulina Fusi, MD, MD Primary Cardiologist:  Dr Daleen Squibb  The patient presents today for electrophysiology followup.  Since last being seen in our clinic, the patient reports that his CHF has improved.  Unfortunately, he has been diagnosed with a Merkel cell carcinoma of the scalp for which he is being evaluated at [facility].  He has resection with subsequent radiation planned.   He feels that his PVCs are improved.  His SOB is much improved.  Today, he denies symptoms of chest pain, orthopnea, PND, lower extremity edema, dizziness, presyncope, syncope, or neurologic sequela.  The patient feels that he is tolerating medications without difficulties and is otherwise without complaint today.   Past Medical History  Diagnosis Date  . Hyperlipidemia   . Hypertension   . CAD (coronary artery disease)     NSTEMI 11/11 -  cath: EF 40%, pLAD 30%, 90%, 30-40%, CFX stent occluded, pRCA 30%, dRCA stent ok, pPDA 40%, 30%;  s/p CABG 11/11;LHC 04/01/11: EF 30%, left RA with 30% ISR, right RA patent, RCA stent patent, severe disease in LAD and CFX, L-LAD, S-PDA, S-OM ok    . Prostate cancer     2006, s/p radical prostate[facility] at [facility]  . Stroke     2007  . Injury of sigmoid colon     perforation requiring hemicolectomy and colostomy with subsequent takedown  . Renal artery stenosis     s/p prior ptca  . Chronic systolic heart failure     echo 9/12: Mild LVH, EF 25%, restrictive diast dysfxn with elevated filling pressures, mild AI, mild enlarged Ao root 36 mm, mild MR, mod LAE, mild RVE, mod reduced RVSF, mild to mod PR, PASP 62-66 (mod pulmonary HTN), mod pleural effusion  . Ischemic cardiomyopathy   . Carotid stenosis     bilat 40-59% in 8/11  . Diabetes mellitus   . Atrial fibrillation   . Acute myocardial infarction, subendocardial infarction, subsequent episode of care   . Depression   . Gout   . Anxiety   . Osteoarthritis   . PVC's (premature ventricular contractions)     RBB R  superior axis PVCs frequently in bigeminy   Past Surgical History  Procedure Date  . Colon surgery   . Prostate surgery   . Coronary artery bypass graft     Current Outpatient Prescriptions  Medication Sig Dispense Refill  . aspirin EC 81 MG tablet Take 81 mg by mouth daily.        . carvedilol (COREG) 12.5 MG tablet Take 1.5 tablets (18.75 mg total) by mouth 2 (two) times daily.  90 tablet  6  . cloNIDine (CATAPRES) 0.1 MG tablet Take 1 tablet (0.1 mg total) by mouth 2 (two) times daily.      . clopidogrel (PLAVIX) 75 MG tablet Take 1 tablet (75 mg total) by mouth daily.  90 tablet  3  . febuxostat (ULORIC) 40 MG tablet Take 80 mg by mouth daily.      . furosemide (LASIX) 40 MG tablet Take 80 mg by mouth daily.       Marland Kitchen guaiFENesin (MUCINEX) 600 MG 12 hr tablet Take 400 mg by mouth 2 (two) times daily.       . hydrALAZINE (APRESOLINE) 25 MG tablet Take 1 tablet (25 mg total) by mouth 3 (three) times daily.      . isosorbide dinitrate (ISORDIL) 20 MG tablet Take 1 tablet (20 mg total) by mouth 3 (three) times daily.  270 tablet  3  . lansoprazole (PREVACID) 30 MG capsule Take 30 mg by mouth daily.        . nitroGLYCERIN (NITROSTAT) 0.4 MG SL tablet Place 0.4 mg under the tongue every 5 (five) minutes as needed.        . valsartan (DIOVAN) 320 MG tablet Take 320 mg by mouth daily. TAKE 1/2 TABLET BY MOUTH DAILY.      Marland Kitchen DISCONTD: carvedilol (COREG) 12.5 MG tablet Take 1 tablet (12.5 mg total) by mouth 2 (two) times daily.  60 tablet  11    Allergies  Allergen Reactions  . Sulfa Drugs Cross Reactors Rash    Legs only.  . Sulfonamide Derivatives Rash    Legs only.    History   Social History  . Marital Status: Married    Spouse Name: N/A    Number of Children: N/A  . Years of Education: N/A   Occupational History  . Not on file.   Social History Main Topics  . Smoking status: Former Smoker -- 2.0 packs/day for 20 years    Types: Cigarettes    Quit date: 02/08/1977  .  Smokeless tobacco: Never Used  . Alcohol Use: No  . Drug Use: No  . Sexually Active: Not on file   Other Topics Concern  . Not on file   Social History Narrative   Pt lives in [place] with spouse.  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[place] [address], [place]    Family History  Problem Relation Age of Onset  . Heart disease Mother   . Heart disease Father   . Heart disease Sister   . Diabetes Mother    Physical Exam: There were no vitals filed for this visit.  GEN- The patient is well appearing, alert and oriented x 3 today.   Head- normocephalic, atraumatic Eyes-  Sclera clear, conjunctiva pink Ears- hearing intact Oropharynx- clear Neck- supple, no JVP Lymph- no cervical lymphadenopathy Lungs- Clear to ausculation bilaterally, normal work of breathing Heart- Regular rate and rhythm without ectopy today,  GI- soft, NT, ND, + BS Extremities- no clubbing, cyanosis, trace edema MS- no significant deformity or atrophy Skin- no rash or lesion Psych- euthymic mood, full affect Neuro- strength and sensation are intact  Today today reveals sinus rhythm at 58 bpm, PR 200, LVH with repolarization abnormality, no PVCs ekg today reveals sinus bradycardia, PVCs in a RBB R superior axis  Assessment and Plan:

## 2011-09-15 NOTE — Assessment & Plan Note (Signed)
He is at least moderate risk for surgery given CHF and CAD, however I do not feel that we can further reduce his risks presently. Merkel cell resection should be a low risk procedure.  He should therefore proceed with surgery if necessary. He may hold plavix for 5 days prior to surgery and restart 1-2 post op.  Continue ASA if able.

## 2011-09-15 NOTE — Assessment & Plan Note (Signed)
Most recent EF 40-45% Continue medical therapy as currently taking Renal function has limited medical therapy recently

## 2011-09-15 NOTE — Assessment & Plan Note (Signed)
Improved Spironolactone recently stopped due to renal failure and hyperkalemia

## 2011-09-15 NOTE — Assessment & Plan Note (Signed)
Clinically improved Aggressive BP control should help  Continue current medical therapy I would not consider further EP procedures until his scalp malignancy has been addressed and resolved.

## 2011-09-15 NOTE — Patient Instructions (Signed)
Your physician recommends that you schedule a follow-up appointment in: 3 months with Dr Boris Sharper to hold Plavix for 5 days prior to surgery  and restart the day after

## 2011-09-17 ENCOUNTER — Encounter: Payer: Self-pay | Admitting: Cardiology

## 2011-10-17 ENCOUNTER — Encounter: Payer: Self-pay | Admitting: Cardiology

## 2011-11-15 ENCOUNTER — Encounter: Payer: Self-pay | Admitting: Cardiology

## 2011-12-20 ENCOUNTER — Telehealth: Payer: Self-pay | Admitting: Cardiology

## 2011-12-20 ENCOUNTER — Encounter: Payer: Self-pay | Admitting: Physician Assistant

## 2011-12-20 ENCOUNTER — Ambulatory Visit (INDEPENDENT_AMBULATORY_CARE_PROVIDER_SITE_OTHER): Payer: Medicare Other | Admitting: Physician Assistant

## 2011-12-20 VITALS — BP 150/70 | HR 64 | Ht 66.0 in | Wt 168.0 lb

## 2011-12-20 DIAGNOSIS — I10 Essential (primary) hypertension: Secondary | ICD-10-CM

## 2011-12-20 DIAGNOSIS — I493 Ventricular premature depolarization: Secondary | ICD-10-CM

## 2011-12-20 DIAGNOSIS — R5381 Other malaise: Secondary | ICD-10-CM

## 2011-12-20 DIAGNOSIS — C4A9 Merkel cell carcinoma, unspecified: Secondary | ICD-10-CM

## 2011-12-20 DIAGNOSIS — I4891 Unspecified atrial fibrillation: Secondary | ICD-10-CM

## 2011-12-20 DIAGNOSIS — R5383 Other fatigue: Secondary | ICD-10-CM | POA: Insufficient documentation

## 2011-12-20 DIAGNOSIS — R002 Palpitations: Secondary | ICD-10-CM

## 2011-12-20 DIAGNOSIS — I4949 Other premature depolarization: Secondary | ICD-10-CM

## 2011-12-20 DIAGNOSIS — I2510 Atherosclerotic heart disease of native coronary artery without angina pectoris: Secondary | ICD-10-CM

## 2011-12-20 DIAGNOSIS — I5022 Chronic systolic (congestive) heart failure: Secondary | ICD-10-CM

## 2011-12-20 MED ORDER — HYDRALAZINE HCL 50 MG PO TABS: 50.0000 mg | ORAL_TABLET | Freq: Three times a day (TID) | ORAL | Status: DC

## 2011-12-20 NOTE — Telephone Encounter (Signed)
Amanda,[facility] nurse, calls today b/c pt has been feeling increasingly tired and weak. His heart rate today was 48. She states it has been being 86-60. In the office heart rate has been 47-55 Pt stated to her that he feels "more sluggish and weak". Denies any chest discomfort. Pt is very concerned. He has lost 7 pounds over the past 1 1/2 weeks. He went on vacation with his family for a week and was only able to get out and walk once.  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 He has another 35 treatments to go on the neck Pt will be here at 3pm today to see [name] PA .

## 2011-12-20 NOTE — Patient Instructions (Addendum)
Your physician recommends that you schedule a follow-up appointment in: 02/2012 WITH DR. WALL  PLEASE HAVE THE [facility] NURSE SEND SCOTT WEAVER, PAC A COPY OF YOUR BLOOD PRESSURE READINGS AT HER NEXT VISIT WITH YOU   PLEASE KEEP APPOINTMENT WITH YOUR PRIMARY CARE PHYSICIAN IN THE NEXT FEW WEEKS  LAB TODAY BMET, CBC W/DIFF, TSH, MAG LEVEL  INCREASE HYDRALAZINE TO 50 MG 1 TABLET 3 TIMES DAILY A NEW RX WAS SENT IN TODAY FOR THE 50 MG TABLET

## 2011-12-20 NOTE — Telephone Encounter (Signed)
New Problem:    Called in wanting to speak with you about the patient's heart rate: 48. This is abnormal for him and she is concerned that he is bradycardic.  Please call back.

## 2011-12-20 NOTE — Progress Notes (Signed)
[address] [place], [place]  [address] Phone: [phone] Fax:  [phone]  Date:  12/20/2011   Name:  Aaron [name]   DOB:  [birth year]   MRN:  [id number]  PCP:  Paulina Fusi, MD  Primary Cardiologist:  Dr. Valera Castle  Primary Electrophysiologist:  Dr. [name]    History of Present Illness: Aaron [name] is a 71 y.o. male who returns for evaluation of fatigue.  He has a history of CAD with prior stenting to the CFX and RCA. He presented to [facility] in 11/11 with NSTEMI and underwent CABG: L-LAD, S-OM, S-PDA.  Course was complicated by perforated viscous from diverticulitis and he underwent colectomy with colostomy which was subsequently taken down. Other hx includes: carotid stenosis (bilat 40-59% in 8/11), renal insufficiency, renal artery stenosis s/p prior stenting, prior stroke, HTN, HLP, prostate CA s/p prostatectomy.   Sees Dr. Johney Frame for symptomatic PVCs.  EF was 25% in 9/12 by echo.  Admitted 03/2011 with acute CHF and NSTEMI in setting of hypertensive crisis.  LHC 04/01/11: EF 30%, left RA with 30% ISR, right RA patent, RCA stent patent, severe disease in LAD and CFX, patent L-LAD, patent S-PDA, S-OM.  Medical rx was continued.   Last echo 05/2011: Mod LVH, EF 40-45%, mild AI, mild LAE, PASP 36.  Saw Dr. Johney Frame in follow up 08/2011.   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  He had surgery at [facility].  No further EP procedures were recommended by Dr. Johney Frame until his malignancy was addressed and resolved.  He has been going through radiation therapy.  Called in today with complaints of weakness and was added on to my schedule.  Patient has had 30 radiation Tx for his head.  CA moved to his neck and he has had 18 or 35 Tx so far.  Has felt weak since radiation started.  [facility] RN noted HR in 40s.  No syncope.  No near syncope.  No CP, dyspnea, orthopnea, PND or edema.  BP has run high.  Has noted  increased palps this week.  Has a lot anxiety related to his CA diagnosis.  Wt Readings from Last 3 Encounters:  12/20/11 168 lb (76.204 kg)  09/15/11 177 lb 8 oz (80.513 kg)  08/24/11 174 lb (78.926 kg)     Potassium  Date/Time Value Range Status  05/24/2011  9:38 AM 4.7  3.5 - 5.1 mEq/L Final     Creatinine, Ser  Date/Time Value Range Status  05/24/2011  9:38 AM 1.9* 0.4 - 1.5 mg/dL Final   TSH  Date/Time Value Range Status  03/30/2011  6:44 PM 1.934  0.350 - 4.500 uIU/mL Final       Hemoglobin  Date/Time Value Range Status  04/03/2011  5:35 AM 10.2* 13.0 - 17.0 g/dL Final    Past Medical History  Diagnosis Date  . Hyperlipidemia   . Hypertension   . CAD (coronary artery disease)     NSTEMI 11/11 -  cath: EF 40%, pLAD 30%, 90%, 30-40%, CFX stent occluded, pRCA 30%, dRCA stent ok, pPDA 40%, 30%;  s/p CABG 11/11;LHC 04/01/11: EF 30%, left RA with 30% ISR, right RA patent, RCA stent patent, severe disease in LAD and CFX, L-LAD, S-PDA, S-OM ok    . Prostate cancer     2006, s/p radical prostatectomy at [facility]  . Stroke     2007  . Injury of sigmoid colon  perforation requiring hemicolectomy and colostomy with subsequent takedown  . Renal artery stenosis     s/p prior ptca  . Chronic systolic heart failure     echo 9/12: Mild LVH, EF 25%, restrictive diast dysfxn with elevated filling pressures, mild AI, mild enlarged Ao root 36 mm, mild MR, mod LAE, mild RVE, mod reduced RVSF, mild to mod PR, PASP 62-66 (mod pulmonary HTN), mod pleural effusion;  echo 05/2011: Mod LVH, EF 40-45%, mild AI, mild LAE, PASP 36  . Ischemic cardiomyopathy   . Carotid stenosis     bilat 40-59% in 8/11  . Diabetes mellitus   . Atrial fibrillation   . Acute myocardial infarction, subendocardial infarction, subsequent episode of care   . Depression   . Gout   . Anxiety   . Osteoarthritis   . PVC's (premature ventricular contractions)     RBB R superior axis PVCs frequently in  bigeminy    Current Outpatient Prescriptions  Medication Sig Dispense Refill  . aspirin EC 81 MG tablet Take 81 mg by mouth daily.        . carvedilol (COREG) 12.5 MG tablet Take 1.5 tablets (18.75 mg total) by mouth 2 (two) times daily.  90 tablet  6  . cloNIDine (CATAPRES) 0.1 MG tablet Take 1 tablet (0.1 mg total) by mouth 2 (two) times daily.      . clopidogrel (PLAVIX) 75 MG tablet Take 1 tablet (75 mg total) by mouth daily.  90 tablet  3  . febuxostat (ULORIC) 40 MG tablet Take 80 mg by mouth daily.      . furosemide (LASIX) 40 MG tablet Take 80 mg by mouth daily.       Marland Kitchen guaiFENesin (MUCINEX) 600 MG 12 hr tablet Take 400 mg by mouth 2 (two) times daily.       . hydrALAZINE (APRESOLINE) 25 MG tablet Take 1 tablet (25 mg total) by mouth 3 (three) times daily.      . isosorbide dinitrate (ISORDIL) 20 MG tablet Take 1 tablet (20 mg total) by mouth 3 (three) times daily.  270 tablet  3  . lansoprazole (PREVACID) 30 MG capsule Take 30 mg by mouth daily.        . nitroGLYCERIN (NITROSTAT) 0.4 MG SL tablet Place 0.4 mg under the tongue every 5 (five) minutes as needed.        . valsartan (DIOVAN) 320 MG tablet Take 320 mg by mouth daily. TAKE 1/2 TABLET BY MOUTH DAILY.      Marland Kitchen DISCONTD: carvedilol (COREG) 12.5 MG tablet Take 1 tablet (12.5 mg total) by mouth 2 (two) times daily.  60 tablet  11    Allergies: Allergies  Allergen Reactions  . Sulfa Drugs Cross Reactors Rash    Legs only.  . Sulfonamide Derivatives Rash    Legs only.    History  Substance Use Topics  . Smoking status: Former Smoker -- 2.0 packs/day for 20 years    Types: Cigarettes    Quit date: 02/08/1977  . Smokeless tobacco: Never Used  . Alcohol Use: No     ROS:  Please see the history of present illness.   He feels cold.  He is constipated.  All other systems reviewed and negative.   PHYSICAL EXAM: VS:  BP 150/70  Pulse 64  Ht 5\' 6"  (1.676 m)  Wt 168 lb (76.204 kg)  BMI 27.12 kg/m2 Well nourished, well  developed, in no acute distress HEENT: normal Neck: no JVD Endo: no  thyromegaly Cardiac:  normal S1, S2; RRR; no murmur Lungs:  clear to auscultation bilaterally, no wheezing, rhonchi or rales Abd: soft, nontender, no hepatomegaly Ext: no edema Skin: warm and dry Neuro:  CNs 2-12 intact, no focal abnormalities noted  EKG:  Sinus brady, occ PVCs, HR 64, LAD, TW inversions 1, aVL, no change from prior tracing      ASSESSMENT AND PLAN:  1.  Fatigue Most likely multifactorial.  Related to deconditioning from Cancer and radiation Rx. Also, BP has been elevated.  BP when first taken today was 180/90.  He has some baseline bradycardia.  Also, has frequent PVCs.   At this point, would not reduce dose of beta blocker. Will check TSH, BMET, CBC today.   Adjust anti-hypertensive medications.    2.  PVC's May be increase sensation due to uncontrolled BP. As noted previously by Dr. Johney Frame, aggressive BP control may help sensation of PVCs. Adjust Rx for BP as noted  3.  Bradycardia Not likely cause of fatigue. No change in beta blocker Rx at this time.  4.  Chronic Systolic CHF Volume stable. Continue current Rx.  5.  Coronary Artery  No angina. Continue ASA and Plavix.  6.  Merkel Cell Cancer Continued management with radiation oncology at [facility].  7.  Hypertension Increase Hydralazine to 50 mg TID.   Signed, [name], PA-C  3:38 PM 12/20/2011

## 2011-12-21 ENCOUNTER — Ambulatory Visit: Payer: Medicare Other | Admitting: Nurse Practitioner

## 2011-12-21 LAB — CBC WITH DIFFERENTIAL/PLATELET
Basophils Absolute: 0 10*3/uL (ref 0.0–0.1)
Basophils Relative: 0.3 % (ref 0.0–3.0)
Eosinophils Absolute: 0.1 10*3/uL (ref 0.0–0.7)
Eosinophils Relative: 2 % (ref 0.0–5.0)
HCT: 34 % — ABNORMAL LOW (ref 39.0–52.0)
Hemoglobin: 10.4 g/dL — ABNORMAL LOW (ref 13.0–17.0)
Lymphocytes Relative: 6.7 % — ABNORMAL LOW (ref 12.0–46.0)
Lymphs Abs: 0.5 10*3/uL — ABNORMAL LOW (ref 0.7–4.0)
MCHC: 30.6 g/dL (ref 30.0–36.0)
MCV: 75.1 fl — ABNORMAL LOW (ref 78.0–100.0)
Monocytes Absolute: 0.4 10*3/uL (ref 0.1–1.0)
Monocytes Relative: 6 % (ref 3.0–12.0)
Neutro Abs: 6.3 10*3/uL (ref 1.4–7.7)
Neutrophils Relative %: 85 % — ABNORMAL HIGH (ref 43.0–77.0)
Platelets: 154 10*3/uL (ref 150.0–400.0)
RBC: 4.53 Mil/uL (ref 4.22–5.81)
RDW: 18.8 % — ABNORMAL HIGH (ref 11.5–14.6)
WBC: 7.4 10*3/uL (ref 4.5–10.5)

## 2011-12-21 LAB — TSH: TSH: 1.69 u[IU]/mL (ref 0.35–5.50)

## 2011-12-21 LAB — BASIC METABOLIC PANEL
BUN: 34 mg/dL — ABNORMAL HIGH (ref 6–23)
CO2: 28 mEq/L (ref 19–32)
Calcium: 9 mg/dL (ref 8.4–10.5)
Chloride: 107 mEq/L (ref 96–112)
Creatinine, Ser: 1.6 mg/dL — ABNORMAL HIGH (ref 0.4–1.5)
GFR: 47.38 mL/min — ABNORMAL LOW (ref >60.00)
Glucose, Bld: 105 mg/dL — ABNORMAL HIGH (ref 70–99)
Potassium: 3.7 mEq/L (ref 3.5–5.1)
Sodium: 145 mEq/L (ref 135–145)

## 2011-12-21 LAB — MAGNESIUM: Magnesium: 2.1 mg/dL (ref 1.5–2.5)

## 2012-01-01 ENCOUNTER — Other Ambulatory Visit: Payer: Self-pay | Admitting: Cardiology

## 2012-01-02 ENCOUNTER — Encounter: Payer: Self-pay | Admitting: Cardiology

## 2012-01-26 ENCOUNTER — Encounter: Payer: Self-pay | Admitting: Cardiology

## 2012-02-22 ENCOUNTER — Encounter: Payer: Self-pay | Admitting: Cardiology

## 2012-02-22 ENCOUNTER — Ambulatory Visit (INDEPENDENT_AMBULATORY_CARE_PROVIDER_SITE_OTHER): Payer: Medicare Other | Admitting: Cardiology

## 2012-02-22 VITALS — BP 168/83 | HR 49 | Ht 66.0 in | Wt 164.8 lb

## 2012-02-22 DIAGNOSIS — I493 Ventricular premature depolarization: Secondary | ICD-10-CM

## 2012-02-22 DIAGNOSIS — I701 Atherosclerosis of renal artery: Secondary | ICD-10-CM

## 2012-02-22 DIAGNOSIS — R002 Palpitations: Secondary | ICD-10-CM

## 2012-02-22 DIAGNOSIS — I4949 Other premature depolarization: Secondary | ICD-10-CM

## 2012-02-22 DIAGNOSIS — I10 Essential (primary) hypertension: Secondary | ICD-10-CM

## 2012-02-22 DIAGNOSIS — E785 Hyperlipidemia, unspecified: Secondary | ICD-10-CM

## 2012-02-22 DIAGNOSIS — I4891 Unspecified atrial fibrillation: Secondary | ICD-10-CM

## 2012-02-22 DIAGNOSIS — I6529 Occlusion and stenosis of unspecified carotid artery: Secondary | ICD-10-CM

## 2012-02-22 DIAGNOSIS — C4A9 Merkel cell carcinoma, unspecified: Secondary | ICD-10-CM

## 2012-02-22 DIAGNOSIS — I2510 Atherosclerotic heart disease of native coronary artery without angina pectoris: Secondary | ICD-10-CM

## 2012-02-22 DIAGNOSIS — I5022 Chronic systolic (congestive) heart failure: Secondary | ICD-10-CM

## 2012-02-22 DIAGNOSIS — R609 Edema, unspecified: Secondary | ICD-10-CM

## 2012-02-22 NOTE — Assessment & Plan Note (Signed)
Reasonable control for him. No change in medications.

## 2012-02-22 NOTE — Assessment & Plan Note (Signed)
Stable

## 2012-02-22 NOTE — Assessment & Plan Note (Signed)
Slightly improved.

## 2012-02-22 NOTE — Assessment & Plan Note (Signed)
Improved

## 2012-02-22 NOTE — Assessment & Plan Note (Signed)
Stable. No change in medical therapy. We'll see him back in November.

## 2012-02-22 NOTE — Assessment & Plan Note (Signed)
Stable. Continue secondary preventative therapy. 

## 2012-02-22 NOTE — Patient Instructions (Addendum)
Your physician recommends that you continue on your current medications as directed. Please refer to the Current Medication list given to you today.  Your physician recommends that you schedule a follow-up appointment in: November 2013

## 2012-02-22 NOTE — Progress Notes (Signed)
HPI Rev Aaron [name] returns today for followup of his multiple cardiac and vascular issues. 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 He is very fatigued. He has not been able to exercise very much.  His weight has been generally stable. He denies orthopnea PND or increased edema. He still has a lot of palpitations with PVCs. He's also had some atrial fib which is been initially evaluated by Dr. Johney Frame. With his tumor, this is been put on hold.  Is very compliant with his meds. Blood pressure continues to be hard to control.  Past Medical History  Diagnosis Date  . Hyperlipidemia   . Hypertension   . CAD (coronary artery disease)     NSTEMI 11/11 -  cath: EF 40%, pLAD 30%, 90%, 30-40%, CFX stent occluded, pRCA 30%, dRCA stent ok, pPDA 40%, 30%;  s/p CABG 11/11;LHC 04/01/11: EF 30%, left RA with 30% ISR, right RA patent, RCA stent patent, severe disease in LAD and CFX, L-LAD, S-PDA, S-OM ok    . Prostate cancer     2006, s/p radical prostatectomy at [facility]  . Stroke     2007  . Injury of sigmoid colon     perforation requiring hemicolectomy and colostomy with subsequent takedown  . Renal artery stenosis     s/p prior ptca  . Chronic systolic heart failure     echo 9/12: Mild LVH, EF 25%, restrictive diast dysfxn with elevated filling pressures, mild AI, mild enlarged Ao root 36 mm, mild MR, mod LAE, mild RVE, mod reduced RVSF, mild to mod PR, PASP 62-66 (mod pulmonary HTN), mod pleural effusion;  echo 05/2011: Mod LVH, EF 40-45%, mild AI, mild LAE, PASP 36  . Ischemic cardiomyopathy   . Carotid stenosis     bilat 40-59% in 8/11  . Diabetes mellitus   . Atrial fibrillation   . Acute myocardial infarction, subendocardial infarction, subsequent episode of care   . Depression   . Gout   . Anxiety   . Osteoarthritis   . PVC's (premature ventricular contractions)     RBB R superior axis PVCs frequently in bigeminy     Current Outpatient Prescriptions  Medication Sig Dispense Refill  . aspirin EC 81 MG tablet Take 81 mg by mouth daily.        . carvedilol (COREG) 12.5 MG tablet TAKE ONE & ONE-HALF TABLETS BY MOUTH TWICE DAILY  90 tablet  5  . cloNIDine (CATAPRES) 0.1 MG tablet Take 1 tablet (0.1 mg total) by mouth 2 (two) times daily.      . clopidogrel (PLAVIX) 75 MG tablet Take 1 tablet (75 mg total) by mouth daily.  90 tablet  3  . colchicine 0.6 MG tablet Take 0.6 mg by mouth as needed.      . febuxostat (ULORIC) 40 MG tablet Take 80 mg by mouth daily.      . furosemide (LASIX) 40 MG tablet Take 80 mg by mouth daily.       Marland Kitchen guaiFENesin (MUCINEX) 600 MG 12 hr tablet Take 400 mg by mouth 2 (two) times daily.       . hydrALAZINE (APRESOLINE) 50 MG tablet Take 25 mg by mouth 3 (three) times daily.      . isosorbide dinitrate (ISORDIL) 20 MG tablet Take 1 tablet (20 mg total) by mouth 3 (three) times daily.  270 tablet  3  . lansoprazole (PREVACID) 30 MG capsule Take 30 mg by  mouth daily.        . nitroGLYCERIN (NITROSTAT) 0.4 MG SL tablet Place 0.4 mg under the tongue every 5 (five) minutes as needed.        . sitaGLIPtin (JANUVIA) 50 MG tablet Take 50 mg by mouth daily.      . valsartan (DIOVAN) 320 MG tablet Take 320 mg by mouth daily. TAKE 1/2 TABLET BY MOUTH DAILY.      Marland Kitchen DISCONTD: hydrALAZINE (APRESOLINE) 50 MG tablet Take 1 tablet (50 mg total) by mouth 3 (three) times daily.  90 tablet  11  . DISCONTD: carvedilol (COREG) 12.5 MG tablet Take 1 tablet (12.5 mg total) by mouth 2 (two) times daily.  60 tablet  11    Allergies  Allergen Reactions  . Sulfa Drugs Cross Reactors Rash    Legs only.  . Sulfonamide Derivatives Rash    Legs only.    Family History  Problem Relation Age of Onset  . Heart disease Mother   . Heart disease Father   . Heart disease Sister   . Diabetes Mother     History   Social History  . Marital Status: Married    Spouse Name: N/A    Number of Children: N/A   . Years of Education: N/A   Occupational History  . Not on file.   Social History Main Topics  . Smoking status: Former Smoker -- 2.0 packs/day for 20 years    Types: Cigarettes    Quit date: 02/08/1977  . Smokeless tobacco: Never Used  . Alcohol Use: No  . Drug Use: No  . Sexually Active: Not on file   Other Topics Concern  . Not on file   Social History Narrative   Pt lives in [place] with spouse.  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 HEENT: symmetrical face, PERRLA, good dentition  Neck: no JVD, thyromegaly, or adenopathy, trachea midline Chest: symmetric without deformity Cardiac: PMI non-displaced, irregular rate and rhythm, normal S1, S2, no gallop or murmur Lung: clear to ausculation and percussion Vascular: all pulses full without bruits  Abdominal: nondistended, nontender, good bowel sounds, no HSM, no bruits Extremities: no cyanosis, clubbing or edema, no sign of DVT, no varicosities  Skin: normal color, no rashes Neuro: alert and oriented x 3, non-focal Pysch: normal affect  EKG Sinus bradycardia with first-degree block, incomplete right bundle, LVH with strain. BMET    Component Value Date/Time   NA 145 12/20/2011 1611   K 3.7 12/20/2011 1611   CL 107 12/20/2011 1611   CO2 28 12/20/2011 1611   GLUCOSE 105* 12/20/2011 1611   BUN 34* 12/20/2011 1611   CREATININE 1.6* 12/20/2011 1611   CALCIUM 9.0 12/20/2011 1611   GFRNONAA 42* 04/03/2011 0535   GFRAA 49* 04/03/2011 0535    Lipid Panel     Component Value Date/Time   CHOL 210* 05/24/2011 0938   TRIG 292.0* 05/24/2011 0938   HDL 33.90* 05/24/2011 0938   CHOLHDL 6 05/24/2011 0938   VLDL 58.4* 05/24/2011 0938   LDLCALC 125* 03/31/2011 0238    CBC    Component Value Date/Time   WBC 7.4 12/20/2011 1611   RBC 4.53 12/20/2011 1611   HGB 10.4* 12/20/2011 1611   HCT  34.0* 12/20/2011 1611   PLT 154.0 12/20/2011 1611   MCV 75.1* 12/20/2011 1611  MCH 23.4* 04/03/2011 0535   MCHC 30.6 12/20/2011 1611   RDW 18.8* 12/20/2011 1611   LYMPHSABS 0.5* 12/20/2011 1611   MONOABS 0.4 12/20/2011 1611   EOSABS 0.1 12/20/2011 1611   BASOSABS 0.0 12/20/2011 1611

## 2012-04-16 ENCOUNTER — Other Ambulatory Visit: Payer: Self-pay

## 2012-04-17 MED ORDER — FUROSEMIDE 40 MG PO TABS: 80.0000 mg | ORAL_TABLET | Freq: Every day | ORAL | Status: DC

## 2012-04-19 ENCOUNTER — Encounter: Payer: Self-pay | Admitting: Cardiology

## 2012-04-23 ENCOUNTER — Encounter: Payer: Self-pay | Admitting: Cardiology

## 2012-04-26 ENCOUNTER — Encounter: Payer: Self-pay | Admitting: Cardiology

## 2012-04-26 ENCOUNTER — Ambulatory Visit (INDEPENDENT_AMBULATORY_CARE_PROVIDER_SITE_OTHER): Payer: Medicare Other | Admitting: Cardiology

## 2012-04-26 VITALS — BP 152/80 | HR 50 | Ht 66.0 in | Wt 153.0 lb

## 2012-04-26 DIAGNOSIS — I4891 Unspecified atrial fibrillation: Secondary | ICD-10-CM

## 2012-04-26 DIAGNOSIS — I5022 Chronic systolic (congestive) heart failure: Secondary | ICD-10-CM

## 2012-04-26 DIAGNOSIS — R002 Palpitations: Secondary | ICD-10-CM

## 2012-04-26 DIAGNOSIS — I2510 Atherosclerotic heart disease of native coronary artery without angina pectoris: Secondary | ICD-10-CM

## 2012-04-26 DIAGNOSIS — N184 Chronic kidney disease, stage 4 (severe): Secondary | ICD-10-CM

## 2012-04-26 DIAGNOSIS — I10 Essential (primary) hypertension: Secondary | ICD-10-CM

## 2012-04-26 DIAGNOSIS — R609 Edema, unspecified: Secondary | ICD-10-CM

## 2012-04-26 DIAGNOSIS — I6529 Occlusion and stenosis of unspecified carotid artery: Secondary | ICD-10-CM

## 2012-04-26 DIAGNOSIS — I4949 Other premature depolarization: Secondary | ICD-10-CM

## 2012-04-26 DIAGNOSIS — E785 Hyperlipidemia, unspecified: Secondary | ICD-10-CM

## 2012-04-26 DIAGNOSIS — I493 Ventricular premature depolarization: Secondary | ICD-10-CM

## 2012-04-26 NOTE — Assessment & Plan Note (Signed)
Stable. Excellent management by Dr. Tomasa Blase with edema. Currently renal function is stable. I changed no medicines today. We'll followup in 3 months. Patient will continue to weigh daily. He's very compliant. No other active issues.

## 2012-04-26 NOTE — Progress Notes (Signed)
HPI Aaron [name] comes in today for close monitoring of his complex cardiovascular history. He has finished chemotherapy for his cancer and had a clear PET scan in October. He has significant edema but his physician Dr. Tomasa Blase has is under good control with weekly metolazone and daily furosemide. Blood work being monitored by primary care.  He denies any chest pain, orthopnea, PND and his edema has resolved. He does have palpitations which are chronic. Very compliant with medications and is being followed closely in the outpatient setting by [facility] care management with Marchelle Folks.  Past Medical History  Diagnosis Date  . Hyperlipidemia   . Hypertension   . CAD (coronary artery disease)     NSTEMI 11/11 -  cath: EF 40%, pLAD 30%, 90%, 30-40%, CFX stent occluded, pRCA 30%, dRCA stent ok, pPDA 40%, 30%;  s/p CABG 11/11;LHC 04/01/11: EF 30%, left RA with 30% ISR, right RA patent, RCA stent patent, severe disease in LAD and CFX, L-LAD, S-PDA, S-OM ok    . Prostate cancer     2006, s/p radical prostatectomy at [facility]  . Stroke     2007  . Injury of sigmoid colon     perforation requiring hemicolectomy and colostomy with subsequent takedown  . Renal artery stenosis     s/p prior ptca  . Chronic systolic heart failure     echo 9/12: Mild LVH, EF 25%, restrictive diast dysfxn with elevated filling pressures, mild AI, mild enlarged Ao root 36 mm, mild MR, mod LAE, mild RVE, mod reduced RVSF, mild to mod PR, PASP 62-66 (mod pulmonary HTN), mod pleural effusion;  echo 05/2011: Mod LVH, EF 40-45%, mild AI, mild LAE, PASP 36  . Ischemic cardiomyopathy   . Carotid stenosis     bilat 40-59% in 8/11  . Diabetes mellitus   . Atrial fibrillation   . Acute myocardial infarction, subendocardial infarction, subsequent episode of care   . Depression   . Gout   . Anxiety   . Osteoarthritis   . PVC's (premature ventricular contractions)     RBB R superior axis PVCs frequently in bigeminy    Current  Outpatient Prescriptions  Medication Sig Dispense Refill  . aspirin EC 81 MG tablet Take 81 mg by mouth daily.        . carvedilol (COREG) 12.5 MG tablet TAKE ONE & ONE-HALF TABLETS BY MOUTH TWICE DAILY  90 tablet  5  . cloNIDine (CATAPRES) 0.1 MG tablet Take 1 tablet (0.1 mg total) by mouth 2 (two) times daily.      . clopidogrel (PLAVIX) 75 MG tablet Take 1 tablet (75 mg total) by mouth daily.  90 tablet  3  . colchicine 0.6 MG tablet Take 0.6 mg by mouth as needed.      . furosemide (LASIX) 40 MG tablet Take 2 tablets (80 mg total) by mouth daily.  60 tablet  2  . guaiFENesin (MUCINEX) 600 MG 12 hr tablet Take 400 mg by mouth 2 (two) times daily.       . hydrALAZINE (APRESOLINE) 50 MG tablet Take 25 mg by mouth 3 (three) times daily.      . isosorbide dinitrate (ISORDIL) 20 MG tablet Take 1 tablet (20 mg total) by mouth 3 (three) times daily.  270 tablet  3  . KLOR-CON M20 20 MEQ tablet Take 2 tablets by mouth Every Monday.      . lansoprazole (PREVACID) 30 MG capsule Take 30 mg by mouth daily.        Marland Kitchen  metolazone (ZAROXOLYN) 2.5 MG tablet Take 1 tablet by mouth Every Monday.      . nitroGLYCERIN (NITROSTAT) 0.4 MG SL tablet Place 0.4 mg under the tongue every 5 (five) minutes as needed.        . sitaGLIPtin (JANUVIA) 50 MG tablet Take 50 mg by mouth daily.      Marland Kitchen ULORIC 80 MG TABS Take 1 tablet by mouth daily.      . valsartan (DIOVAN) 320 MG tablet Take 320 mg by mouth daily.         Allergies  Allergen Reactions  . Sulfa Drugs Cross Reactors Rash    Legs only.  . Sulfonamide Derivatives Rash    Legs only.    Family History  Problem Relation Age of Onset  . Heart disease Mother   . Heart disease Father   . Heart disease Sister   . Diabetes Mother     History   Social History  . Marital Status: Married    Spouse Name: N/A    Number of Children: N/A  . Years of Education: N/A   Occupational History  . Not on file.   Social History Main Topics  . Smoking status:  Former Smoker -- 2.0 packs/day for 20 years    Types: Cigarettes    Quit date: 02/08/1977  . Smokeless tobacco: Never Used  . Alcohol Use: No  . Drug Use: No  . Sexually Active: Not on file   Other Topics Concern  . Not on file   Social History Narrative   Pt lives in [place] with spouse.  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[employer], [employer]    ROS ALL NEGATIVE EXCEPT THOSE NOTED IN HPI  PE  General Appearance: well developed, well nourished in no acute distress HEENT: symmetrical face, PERRLA, good dentition  Neck: no JVD, thyromegaly, or adenopathy, trachea midline Chest: symmetric without deformity Cardiac: PMI non-displaced, RRR, normal S1, S2, no gallop or murmur Lung: clear to ausculation and percussion Vascular: all pulses full without bruits  Abdominal: nondistended, nontender, good bowel sounds, no HSM, no bruits Extremities: no cyanosis, clubbing or edema, no sign of DVT, no varicosities  Skin: normal color, no rashes Neuro: alert and oriented x 3, non-focal Pysch: normal affect  EKG  BMET    Component Value Date/Time   NA 145 12/20/2011 1611   K 3.7 12/20/2011 1611   CL 107 12/20/2011 1611   CO2 28 12/20/2011 1611   GLUCOSE 105* 12/20/2011 1611   BUN 34* 12/20/2011 1611   CREATININE 1.6* 12/20/2011 1611   CALCIUM 9.0 12/20/2011 1611   GFRNONAA 42* 04/03/2011 0535   GFRAA 49* 04/03/2011 0535    Lipid Panel     Component Value Date/Time   CHOL 210* 05/24/2011 0938   TRIG 292.0* 05/24/2011 0938   HDL 33.90* 05/24/2011 0938   CHOLHDL 6 05/24/2011 0938   VLDL 58.4* 05/24/2011 0938   LDLCALC 125* 03/31/2011 0238    CBC    Component Value Date/Time   WBC 7.4 12/20/2011 1611   RBC 4.53 12/20/2011 1611   HGB 10.4* 12/20/2011 1611   HCT 34.0* 12/20/2011 1611   PLT 154.0 12/20/2011 1611   MCV 75.1* 12/20/2011 1611   MCH 23.4* 04/03/2011 0535   MCHC 30.6 12/20/2011 1611   RDW 18.8* 12/20/2011 1611   LYMPHSABS 0.5* 12/20/2011 1611   MONOABS 0.4 12/20/2011 1611   EOSABS 0.1 12/20/2011  1611   BASOSABS 0.0 12/20/2011 1611

## 2012-04-26 NOTE — Patient Instructions (Signed)
Your physician recommends that you continue on your current medications as directed. Please refer to the Current Medication list given to you today.  Your physician wants you to follow-up in: 3 months with Dr. Wall. You will receive a reminder letter in the mail two months in advance. If you don't receive a letter, please call our office to schedule the follow-up appointment.  

## 2012-04-30 ENCOUNTER — Other Ambulatory Visit (HOSPITAL_COMMUNITY): Payer: Self-pay | Admitting: Internal Medicine

## 2012-05-01 ENCOUNTER — Telehealth: Payer: Self-pay | Admitting: Cardiology

## 2012-05-01 NOTE — Telephone Encounter (Signed)
He knows he has severe kidney insufficiency. He is currently stable on medication. He is high risk of going on to needing dialysis in the future. This classification is for coding.

## 2012-05-01 NOTE — Telephone Encounter (Signed)
plz return call to pt [phone] regarding questions about medical care

## 2012-05-01 NOTE — Telephone Encounter (Signed)
Pt advised, no further questions.

## 2012-05-01 NOTE — Telephone Encounter (Signed)
He knows he  has severe renal insufficiency which he does know about. He is stable right now but certainly is at risk of getting worse and having to potentially go on dialysis in the future. This is just a classification that we have to use for coding.

## 2012-05-01 NOTE — Telephone Encounter (Signed)
Spoke with pt. Pt states he had office visit with Dr Daleen Squibb 04/26/12. After Visit Summary has CKD stage IV as a diagnosis. Pt states he is unaware of this diagnosis and would like to discuss this with Dr Daleen Squibb. Pt aware Dr Daleen Squibb not in office until the end of the week. I will forward to Dr Daleen Squibb.

## 2012-06-26 ENCOUNTER — Encounter: Payer: Self-pay | Admitting: Cardiology

## 2012-07-03 ENCOUNTER — Other Ambulatory Visit: Payer: Self-pay | Admitting: *Deleted

## 2012-07-03 MED ORDER — CARVEDILOL 12.5 MG PO TABS: ORAL_TABLET | ORAL | Status: DC

## 2012-07-05 ENCOUNTER — Encounter: Payer: Self-pay | Admitting: Cardiology

## 2012-07-18 ENCOUNTER — Other Ambulatory Visit: Payer: Self-pay

## 2012-07-18 MED ORDER — FUROSEMIDE 40 MG PO TABS: 80.0000 mg | ORAL_TABLET | Freq: Every day | ORAL | Status: DC

## 2012-07-24 ENCOUNTER — Ambulatory Visit: Payer: Medicare Other | Admitting: Cardiology

## 2012-08-01 ENCOUNTER — Ambulatory Visit (INDEPENDENT_AMBULATORY_CARE_PROVIDER_SITE_OTHER): Payer: Medicare Other | Admitting: Cardiology

## 2012-08-01 ENCOUNTER — Encounter: Payer: Self-pay | Admitting: Cardiology

## 2012-08-01 VITALS — BP 158/60 | HR 51 | Ht 66.0 in | Wt 160.0 lb

## 2012-08-01 DIAGNOSIS — I2510 Atherosclerotic heart disease of native coronary artery without angina pectoris: Secondary | ICD-10-CM

## 2012-08-01 DIAGNOSIS — R002 Palpitations: Secondary | ICD-10-CM

## 2012-08-01 DIAGNOSIS — I10 Essential (primary) hypertension: Secondary | ICD-10-CM

## 2012-08-01 DIAGNOSIS — C4A9 Merkel cell carcinoma, unspecified: Secondary | ICD-10-CM

## 2012-08-01 DIAGNOSIS — E785 Hyperlipidemia, unspecified: Secondary | ICD-10-CM

## 2012-08-01 DIAGNOSIS — I6529 Occlusion and stenosis of unspecified carotid artery: Secondary | ICD-10-CM

## 2012-08-01 DIAGNOSIS — I4891 Unspecified atrial fibrillation: Secondary | ICD-10-CM

## 2012-08-01 DIAGNOSIS — I701 Atherosclerosis of renal artery: Secondary | ICD-10-CM

## 2012-08-01 LAB — BASIC METABOLIC PANEL
BUN: 38 mg/dL — ABNORMAL HIGH (ref 6–23)
Calcium: 9 mg/dL (ref 8.4–10.5)
GFR: 41.94 mL/min — ABNORMAL LOW (ref >60.00)
Glucose, Bld: 106 mg/dL — ABNORMAL HIGH (ref 70–99)
Potassium: 3.9 mEq/L (ref 3.5–5.1)

## 2012-08-01 MED ORDER — CLOPIDOGREL BISULFATE 75 MG PO TABS: 75.0000 mg | ORAL_TABLET | Freq: Every day | ORAL | Status: AC

## 2012-08-01 MED ORDER — CLOPIDOGREL BISULFATE 75 MG PO TABS: 75.0000 mg | ORAL_TABLET | Freq: Every day | ORAL | Status: DC

## 2012-08-01 NOTE — Assessment & Plan Note (Signed)
Relatively good control for him. No changes in treatment. We'll check metabolic profile.

## 2012-08-01 NOTE — Assessment & Plan Note (Signed)
Attending sinus rhythm at present. He is bradycardic he is relatively asymptomatic. Efforts in the past to lower his beta blocker has resulted in more palpitations. He and I will not make any changes today.

## 2012-08-01 NOTE — Progress Notes (Signed)
HPI Aaron [name] turns today for evaluation and management of his complex cardiovascular history.  His tumor is in remission and he is feeling pretty well. He has intermittent palpitations but they are much less than before. He occasionally gets some orthostatic symptoms this is infrequent. He denies any angina or chest pain. He said orthopnea PND or increased edema. Blood pressures pretty good today for him. He has due for a Dopplers and we need electrolytes and check his renal function.  Past Medical History  Diagnosis Date  . Hyperlipidemia   . Hypertension   . CAD (coronary artery disease)     NSTEMI 11/11 -  cath: EF 40%, pLAD 30%, 90%, 30-40%, CFX stent occluded, pRCA 30%, dRCA stent ok, pPDA 40%, 30%;  s/p CABG 11/11;LHC 04/01/11: EF 30%, left RA with 30% ISR, right RA patent, RCA stent patent, severe disease in LAD and CFX, L-LAD, S-PDA, S-OM ok    . Prostate cancer     2006, s/p radical prostatectomy at [facility]  . Stroke     2007  . Injury of sigmoid colon     perforation requiring hemicolectomy and colostomy with subsequent takedown  . Renal artery stenosis     s/p prior ptca  . Chronic systolic heart failure     echo 9/12: Mild LVH, EF 25%, restrictive diast dysfxn with elevated filling pressures, mild AI, mild enlarged Ao root 36 mm, mild Aaron, mod LAE, mild RVE, mod reduced RVSF, mild to mod PR, PASP 62-66 (mod pulmonary HTN), mod pleural effusion;  echo 05/2011: Mod LVH, EF 40-45%, mild AI, mild LAE, PASP 36  . Ischemic cardiomyopathy   . Carotid stenosis     bilat 40-59% in 8/11  . Diabetes mellitus   . Atrial fibrillation   . Acute myocardial infarction, subendocardial infarction, subsequent episode of care   . Depression   . Gout   . Anxiety   . Osteoarthritis   . PVC's (premature ventricular contractions)     RBB R superior axis PVCs frequently in bigeminy    Current Outpatient Prescriptions  Medication Sig Dispense Refill  . aspirin EC 81 MG tablet Take 81 mg  by mouth daily.        . carvedilol (COREG) 12.5 MG tablet Take one and one half tablets by mouth twice daily  90 tablet  5  . cloNIDine (CATAPRES) 0.1 MG tablet Take 1 tablet (0.1 mg total) by mouth 2 (two) times daily.      . clopidogrel (PLAVIX) 75 MG tablet Take 1 tablet (75 mg total) by mouth daily.  90 tablet  3  . colchicine 0.6 MG tablet Take 0.6 mg by mouth as needed.      . furosemide (LASIX) 40 MG tablet Take 2 tablets (80 mg total) by mouth daily.  60 tablet  5  . guaiFENesin (MUCINEX) 600 MG 12 hr tablet Take 400 mg by mouth 2 (two) times daily.       . hydrALAZINE (APRESOLINE) 50 MG tablet Take 25 mg by mouth 3 (three) times daily.      . isosorbide dinitrate (ISORDIL) 20 MG tablet TAKE ONE TABLET BY MOUTH THREE TIMES DAILY  90 tablet  5  . lansoprazole (PREVACID) 30 MG capsule Take 30 mg by mouth daily.        . nitroGLYCERIN (NITROSTAT) 0.4 MG SL tablet Place 0.4 mg under the tongue every 5 (five) minutes as needed.        . sitaGLIPtin (JANUVIA) 50 MG  tablet Take 50 mg by mouth daily.      Marland Kitchen ULORIC 80 MG TABS Take 1 tablet by mouth daily.      . valsartan (DIOVAN) 320 MG tablet Take 320 mg by mouth daily.        No current facility-administered medications for this visit.    Allergies  Allergen Reactions  . Sulfa Drugs Cross Reactors Rash    Legs only.  . Sulfonamide Derivatives Rash    Legs only.    Family History  Problem Relation Age of Onset  . Heart disease Mother   . Heart disease Father   . Heart disease Sister   . Diabetes Mother     History   Social History  . Marital Status: Married    Spouse Name: N/A    Number of Children: N/A  . Years of Education: N/A   Occupational History  . Not on file.   Social History Main Topics  . Smoking status: Former Smoker -- 2.00 packs/day for 20 years    Types: Cigarettes    Quit date: 02/08/1977  . Smokeless tobacco: Never Used  . Alcohol Use: No  . Drug Use: No  . Sexually Active: Not on file   Other  Topics Concern  . Not on file   Social History Narrative   Pt lives in [place] with spouse.  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[employer], [employer]    ROS ALL NEGATIVE EXCEPT THOSE NOTED IN HPI  PE  General Appearance: well developed, well nourished in no acute distress, balding from chemotherapy HEENT: symmetrical face, PERRLA, good dentition  Neck: no JVD, thyromegaly, or adenopathy, trachea midline Chest: symmetric without deformity Cardiac: PMI non-displaced, RRR, normal S1, S2, no gallop, 2/6 systolic murmur at the apex Lung: clear to ausculation and percussion Vascular: Bilateral soft carotid bruits, posterior tibials 2+ over 4+, dorsalis pedis 1+ bilaterally Abdominal: nondistended, nontender, good bowel sounds, no HSM, no bruits Extremities: no cyanosis, clubbing, 1+ pitting edema bilaterally, no sign of DVT, no varicosities  Skin: normal color, no rashes Neuro: alert and oriented x 3, non-focal Pysch: normal affect  EKG Sinus bradycardia, rate 51, [place] depression with T wave inversion in the lateral leads left axis deviation. LVH. No change from previous ECG.  BMET    Component Value Date/Time   NA 145 12/20/2011 1611   K 3.7 12/20/2011 1611   CL 107 12/20/2011 1611   CO2 28 12/20/2011 1611   GLUCOSE 105* 12/20/2011 1611   BUN 34* 12/20/2011 1611   CREATININE 1.6* 12/20/2011 1611   CALCIUM 9.0 12/20/2011 1611   GFRNONAA 42* 04/03/2011 0535   GFRAA 49* 04/03/2011 0535    Lipid Panel     Component Value Date/Time   CHOL 210* 05/24/2011 0938   TRIG 292.0* 05/24/2011 0938   HDL 33.90* 05/24/2011 0938   CHOLHDL 6 05/24/2011 0938   VLDL 58.4* 05/24/2011 0938   LDLCALC 125* 03/31/2011 0238    CBC    Component Value Date/Time   WBC 7.4 12/20/2011 1611   RBC 4.53 12/20/2011 1611   HGB 10.4* 12/20/2011 1611   HCT 34.0* 12/20/2011 1611   PLT 154.0 12/20/2011 1611   MCV 75.1* 12/20/2011 1611   MCH 23.4* 04/03/2011 0535   MCHC 30.6 12/20/2011 1611   RDW 18.8* 12/20/2011 1611   LYMPHSABS 0.5*  12/20/2011 1611   MONOABS 0.4 12/20/2011 1611   EOSABS 0.1 12/20/2011 1611   BASOSABS 0.0 12/20/2011 1611

## 2012-08-01 NOTE — Assessment & Plan Note (Signed)
Stable. Continue secondary preventative therapy. 

## 2012-08-01 NOTE — Assessment & Plan Note (Signed)
We'll arrange repeat carotid Dopplers before seen back in June

## 2012-08-01 NOTE — Patient Instructions (Addendum)
Your physician has requested that you have a carotid duplex May 2014. This test is an ultrasound of the carotid arteries in your neck. It looks at blood flow through these arteries that supply the brain with blood. Allow one hour for this exam. There are no restrictions or special instructions.  Your physician recommends that you schedule a follow-up appointment in: June with Dr. Daleen Squibb.  Your physician recommends that you continue on your current medications as directed. Please refer to the Current Medication list given to you today.

## 2012-09-06 ENCOUNTER — Encounter: Payer: Self-pay | Admitting: Cardiology

## 2012-10-11 ENCOUNTER — Encounter: Payer: Self-pay | Admitting: Cardiology

## 2012-11-15 ENCOUNTER — Encounter (INDEPENDENT_AMBULATORY_CARE_PROVIDER_SITE_OTHER): Payer: Medicare Other

## 2012-11-15 DIAGNOSIS — I6529 Occlusion and stenosis of unspecified carotid artery: Secondary | ICD-10-CM

## 2012-11-28 ENCOUNTER — Ambulatory Visit: Payer: Medicare Other | Admitting: Cardiology

## 2012-12-24 ENCOUNTER — Other Ambulatory Visit: Payer: Self-pay | Admitting: Cardiology

## 2012-12-27 ENCOUNTER — Ambulatory Visit: Payer: Medicare Other | Admitting: Cardiology

## 2013-01-08 ENCOUNTER — Other Ambulatory Visit: Payer: Self-pay | Admitting: *Deleted

## 2013-01-08 MED ORDER — ISOSORBIDE DINITRATE 20 MG PO TABS: ORAL_TABLET | ORAL | Status: DC

## 2013-01-14 ENCOUNTER — Other Ambulatory Visit: Payer: Self-pay | Admitting: Physician Assistant

## 2013-01-14 ENCOUNTER — Other Ambulatory Visit: Payer: Self-pay | Admitting: Cardiology

## 2013-01-24 ENCOUNTER — Encounter: Payer: Self-pay | Admitting: Cardiology

## 2013-02-11 ENCOUNTER — Other Ambulatory Visit: Payer: Self-pay

## 2013-02-11 MED ORDER — FUROSEMIDE 40 MG PO TABS: ORAL_TABLET | ORAL | Status: DC

## 2013-03-11 ENCOUNTER — Other Ambulatory Visit: Payer: Self-pay | Admitting: Cardiology

## 2013-03-13 ENCOUNTER — Encounter: Payer: Self-pay | Admitting: Physician Assistant

## 2013-03-13 ENCOUNTER — Ambulatory Visit (INDEPENDENT_AMBULATORY_CARE_PROVIDER_SITE_OTHER): Payer: Medicare Other | Admitting: Physician Assistant

## 2013-03-13 VITALS — BP 136/72 | HR 65 | Ht 66.0 in | Wt 157.6 lb

## 2013-03-13 DIAGNOSIS — I5022 Chronic systolic (congestive) heart failure: Secondary | ICD-10-CM

## 2013-03-13 DIAGNOSIS — R002 Palpitations: Secondary | ICD-10-CM

## 2013-03-13 DIAGNOSIS — I6529 Occlusion and stenosis of unspecified carotid artery: Secondary | ICD-10-CM

## 2013-03-13 DIAGNOSIS — I2510 Atherosclerotic heart disease of native coronary artery without angina pectoris: Secondary | ICD-10-CM

## 2013-03-13 DIAGNOSIS — I2789 Other specified pulmonary heart diseases: Secondary | ICD-10-CM

## 2013-03-13 DIAGNOSIS — I4891 Unspecified atrial fibrillation: Secondary | ICD-10-CM

## 2013-03-13 DIAGNOSIS — I5041 Acute combined systolic (congestive) and diastolic (congestive) heart failure: Secondary | ICD-10-CM

## 2013-03-13 DIAGNOSIS — I272 Pulmonary hypertension, unspecified: Secondary | ICD-10-CM

## 2013-03-13 NOTE — Assessment & Plan Note (Signed)
Patient doesn't have any evidence of heart failure on exam today. He does have a loud diastolic murmur on exam. We will check a 2-D echo and follow up with Dr. Jens [name].

## 2013-03-13 NOTE — Assessment & Plan Note (Signed)
Patient normal sinus rhythm today

## 2013-03-13 NOTE — Assessment & Plan Note (Signed)
Chronic and stable.   

## 2013-03-13 NOTE — Assessment & Plan Note (Signed)
Carotid Dopplers in May 2014 showed 40-50% bilateral ICA stenosis followup in 1 year

## 2013-03-13 NOTE — Assessment & Plan Note (Signed)
Patient denies any recent chest pain. Continue medical therapy.

## 2013-03-13 NOTE — Patient Instructions (Addendum)
Your physician recommends that you continue on your current medications as directed. Please refer to the Current Medication list given to you today.  Your physician has requested that you have an echocardiogram. Echocardiography is a painless test that uses sound waves to create images of your heart. It provides your doctor with information about the size and shape of your heart and how well your heart's chambers and valves are working. This procedure takes approximately one hour. There are no restrictions for this procedure.  Your physician recommends that you schedule a follow-up appointment in: 1-2 MONTHS WITH DR.CRENSHAW

## 2013-03-13 NOTE — Progress Notes (Signed)
HPI:  This is a 72 year old patient Dr.Wall(now Dr. Jens Som) and Dr. Johney Frame who has a history of coronary artery disease status post prior stenting to the circumflex and RCA. He presented to [facility] in 11/11 with that and STEMI and underwent CABG with a LIMA to the LAD, SVG to the OM, SVG to PDA. This was complicated by perforated viscus from diverticulitis and he underwent colectomy with colostomy that was subsequently taken down. Followup cardiac cath in 03/2011 showed patent grafts and severe LV dysfunction EF 30%. Please see below for details. He also has history of paroxysmal atrial fibrillation, PVC's,renal insufficiency status post stenting for renal artery stenosis, prior stroke, hypertension, and merkel cell cancer of the scalp treated at [facility] with surgery,radiation.Last 2-D echo in 05/2011 ejection fraction was 40-45% with moderate LVH, mild AI, mild left atrial enlargement, PA SP of 36.  The patient comes in today for regular followup. He denies any chest pain, dyspnea, dyspnea on exertion, dizziness or presyncope. He is not exercising as much as he used to since his work also cancer treatment. He does walk regularly. He has chronic palpitations that they have not changed in frequency. He thinks it's due to PVCs and not atrial fibrillation. He did decrease his clonidine to 1 tablet daily because it made him sleepy. He has been watching his blood pressure closely at home and it runs between 120 and 140s systolic over 60. He'd like to come off some of his other medications if at all possible.  Carotid Dopplers in May 2014 showed 40-59% bilateral ICA stenosis. Followup in 1 year.  Allergies:  -- Sulfa Drugs Cross Reactors -- Rash   --  Legs only.  -- Sulfonamide Derivatives -- Rash   --  Legs only.  Current Outpatient Prescriptions on File Prior to Visit: carvedilol (COREG) 12.5 MG tablet, TAKE ONE & ONE-HALF TABLETS BY MOUTH TWICE DAILY, Disp: 90 tablet, Rfl: 2 cloNIDine (CATAPRES)  0.1 MG tablet, Take 0.1 mg by mouth daily. , Disp: , Rfl:  clopidogrel (PLAVIX) 75 MG tablet, Take 1 tablet (75 mg total) by mouth daily., Disp: 90 tablet, Rfl: 3 colchicine 0.6 MG tablet, Take 0.6 mg by mouth as needed., Disp: , Rfl:  hydrALAZINE (APRESOLINE) 50 MG tablet, TAKE ONE TABLET BY MOUTH THREE TIMES DAILY, Disp: 90 tablet, Rfl: 0 isosorbide dinitrate (ISORDIL) 20 MG tablet, TAKE ONE TABLET BY MOUTH THREE TIMES DAILY, Disp: 90 tablet, Rfl: 5 lansoprazole (PREVACID) 30 MG capsule, Take 30 mg by mouth daily.  , Disp: , Rfl:  valsartan (DIOVAN) 320 MG tablet, Take 320 mg by mouth daily. , Disp: , Rfl:  aspirin EC 81 MG tablet, Take 81 mg by mouth daily.  , Disp: , Rfl:  furosemide (LASIX) 40 MG tablet, TAKE TWO TABLETS BY MOUTH ONCE DAILY, Disp: 60 tablet, Rfl: 1 guaiFENesin (MUCINEX) 600 MG 12 hr tablet, Take 400 mg by mouth 2 (two) times daily. , Disp: , Rfl:  nitroGLYCERIN (NITROSTAT) 0.4 MG SL tablet, Place 0.4 mg under the tongue every 5 (five) minutes as needed.  , Disp: , Rfl:  sitaGLIPtin (JANUVIA) 50 MG tablet, Take 50 mg by mouth daily., Disp: , Rfl:  ULORIC 80 MG TABS, Take 1 tablet by mouth daily., Disp: , Rfl:   No current facility-administered medications on file prior to visit.   Past Medical History:   Hyperlipidemia  Hypertension                                                 CAD (coronary artery disease)                                  Comment:NSTEMI 11/11 -  cath: EF 40%, pLAD 30%, 90%,               30-40%, CFX stent occluded, pRCA 30%, dRCA               stent ok, pPDA 40%, 30%;  s/p CABG 11/11;LHC               04/01/11: EF 30%, left RA with 30% ISR, right               RA patent, RCA stent patent, severe disease in               LAD and CFX, L-LAD, S-PDA, S-OM ok     Prostate cancer                                                Comment:2006, s/p radical prostatectomy at [facility]   Stroke                                                          Comment:2007   Injury of sigmoid colon                                        Comment:perforation requiring hemicolectomy and               colostomy with subsequent takedown   Renal artery stenosis                                          Comment:s/p prior ptca   Chronic systolic heart failure                                 Comment:echo 9/12: Mild LVH, EF 25%, restrictive diast               dysfxn with elevated filling pressures, mild               AI, mild enlarged Ao root 36 mm, mild MR, mod               LAE, mild RVE, mod reduced RVSF, mild to mod               PR, PASP 62-66 (mod pulmonary HTN), mod pleural  effusion;  echo 05/2011: Mod LVH, EF 40-45%,               mild AI, mild LAE, PASP 36   Ischemic cardiomyopathy                                      Carotid stenosis                                               Comment:bilat 40-59% in 8/11   Diabetes mellitus                                            Atrial fibrillation                                          Acute myocardial infarction, subendocardial in*              Depression                                                   Gout                                                         Anxiety                                                      Osteoarthritis                                               PVC's (premature ventricular contractions)                     Comment:RBB R superior axis PVCs frequently in bigeminy  Past Surgical History:   COLON SURGERY                                                 PROSTATE SURGERY                                              CORONARY ARTERY BYPASS GRAFT  Review of patient's family history indicates:   Heart disease                  Mother                   Heart disease                  Father                   Heart disease                  Sister                   Diabetes                        Mother                   Social History   Marital Status: Married             Spouse Name:                      Years of Education:                 Number of children:             Occupational History   None on file  Social History Main Topics   Smoking Status: Former Smoker                   Packs/Day: 2.00  Years: 20        Types: Cigarettes     Quit date: 02/08/1977   Smokeless Status: Never Used                       Alcohol Use: No             Drug Use: No             Sexual Activity: Not on file        Other Topics            Concern   None on file  Social History Narrative   Pt lives in [place] with spouse.  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 [address], [place]    ROS: See history of present illness otherwise negative   PHYSICAL EXAM: Well-nournished, in no acute distress. Neck: No JVD, HJR, Bruit, or thyroid enlargement  Lungs: No tachypnea, clear without wheezing, rales, or rhonchi  Cardiovascular: RRR, PMI not displaced, 3/6 diastolic murmur at the left sternal border, 2/6 systolic murmur at the apex,no gallops, bruit, thrill, or heave.  Abdomen: BS normal. Soft without organomegaly, masses, lesions or tenderness.  Extremities: without cyanosis, clubbing or edema. Good distal pulses bilateral  SKin: Warm, no lesions or rashes   Musculoskeletal: No deformities  Neuro: no focal signs  BP 136/72  Pulse 65  Ht 5\' 6"  (1.676 m)  Wt 157 lb 9.6 oz (71.487 kg)  BMI 25.45 kg/m2    EKG: Normal sinus rhythm with first degree AV block nonspecific ST-T wave changes with T wave inversion laterally, no change since February 2014 EKG    FINAL ASSESSMENT: 1. Severe global left ventricular systolic dysfunction with estimated     left ventricular ejection fraction 30%. 2. Severe 2 vessel coronary artery disease involving the left anterior     descending  artery and left circumflex with a patent stent in the     right coronary artery. 3.  Status post coronary bypass surgery with patency of all bypass     grafts (saphenous vein graft to posterior descending artery,     saphenous vein graft to obtuse marginal, left internal mammary     artery to left anterior descending artery). 4. Patent renal arteries bilaterally. 5. Elevated intracardiac filling pressures.   RECOMMENDATIONS:  We will continue medical treatment for cardiomyopathy and congestive heart failure.   Veverly Fells. Excell Seltzer, MD MDC/MEDQ  D:  04/01/2011    Rest lexiscan 10/12: Overall Impression:  Abnormal stress nuclear study with small fixed inferoapical and moderate anterolateral defects suggestive of previous infarcts; no ischemia; study not gated; evidence of LVE  2Decho12/2012: Study Conclusions  - Left ventricle: Diffuse hypokinesis worse in the mid and   especially the basal inferior wall The cavity size was   mildly dilated. Wall thickness was increased in a pattern   of moderate LVH. Systolic function was mildly to   moderately reduced. The estimated ejection fraction was in   the range of 40% to 45%. - Aortic valve: Mild regurgitation. - Left atrium: The atrium was mildly dilated. - Atrial septum: No defect or patent foramen ovale was   identified. - Pulmonary arteries: PA peak pressure: 36mm Hg (S).

## 2013-03-22 ENCOUNTER — Ambulatory Visit: Payer: Medicare Other | Admitting: Cardiology

## 2013-03-25 ENCOUNTER — Other Ambulatory Visit: Payer: Self-pay | Admitting: Cardiology

## 2013-03-25 ENCOUNTER — Ambulatory Visit (HOSPITAL_COMMUNITY): Payer: Medicare Other | Attending: Cardiology | Admitting: Radiology

## 2013-03-25 ENCOUNTER — Other Ambulatory Visit (HOSPITAL_COMMUNITY): Payer: Self-pay | Admitting: Cardiology

## 2013-03-25 DIAGNOSIS — I5041 Acute combined systolic (congestive) and diastolic (congestive) heart failure: Secondary | ICD-10-CM

## 2013-03-25 DIAGNOSIS — I4891 Unspecified atrial fibrillation: Secondary | ICD-10-CM | POA: Insufficient documentation

## 2013-03-25 DIAGNOSIS — Z8673 Personal history of transient ischemic attack (TIA), and cerebral infarction without residual deficits: Secondary | ICD-10-CM | POA: Insufficient documentation

## 2013-03-25 DIAGNOSIS — I10 Essential (primary) hypertension: Secondary | ICD-10-CM | POA: Insufficient documentation

## 2013-03-25 DIAGNOSIS — I079 Rheumatic tricuspid valve disease, unspecified: Secondary | ICD-10-CM | POA: Insufficient documentation

## 2013-03-25 DIAGNOSIS — I2789 Other specified pulmonary heart diseases: Secondary | ICD-10-CM

## 2013-03-25 DIAGNOSIS — I255 Ischemic cardiomyopathy: Secondary | ICD-10-CM

## 2013-03-25 DIAGNOSIS — I2510 Atherosclerotic heart disease of native coronary artery without angina pectoris: Secondary | ICD-10-CM

## 2013-03-25 DIAGNOSIS — R011 Cardiac murmur, unspecified: Secondary | ICD-10-CM | POA: Insufficient documentation

## 2013-03-25 DIAGNOSIS — I509 Heart failure, unspecified: Secondary | ICD-10-CM

## 2013-03-25 DIAGNOSIS — R002 Palpitations: Secondary | ICD-10-CM | POA: Insufficient documentation

## 2013-03-25 DIAGNOSIS — I252 Old myocardial infarction: Secondary | ICD-10-CM | POA: Insufficient documentation

## 2013-03-25 DIAGNOSIS — I080 Rheumatic disorders of both mitral and aortic valves: Secondary | ICD-10-CM | POA: Insufficient documentation

## 2013-03-25 DIAGNOSIS — I2589 Other forms of chronic ischemic heart disease: Secondary | ICD-10-CM

## 2013-03-25 NOTE — Progress Notes (Signed)
Echocardiogram performed.  

## 2013-04-01 ENCOUNTER — Other Ambulatory Visit: Payer: Self-pay | Admitting: Cardiology

## 2013-04-21 IMAGING — RF DG BE W/ CM - WO/W KUB
13 series · 13 of 13 positions shown · non-contrast
Comparison: No prior fluoroscopic imaging.  Two-view abdomen x-ray
05/17/2010.

CLINICAL DATA: Sigmoid colon resection.  The patient scheduled to
have colostomy reversal.

WATER SOLUBLE CONTRAST ENEMA (VIA COLOSTOMY AND JULITA KOZA CAPEK)
11/02/2010:
TECHNIQUE: Initial scout AP supine abdominal image was obtained.
Water soluble contrast was initially introduced into the rectum in
a retrograde fashion and refluxed from the rectum to the sigmoid
colon (the Houlda Raison).  This was drained via gravity.
Colostomy enema was then performed via the descending colostomy,
using a 20-French Foley catheter. This was also drained via
gravity, and a post-drainage image was obtained upon completion.
Fluoroscopy time: 3 minutes, 38 seconds.

[Series 1: run · 1 of 1 slices shown (1 of 13)]
[im 1/1]
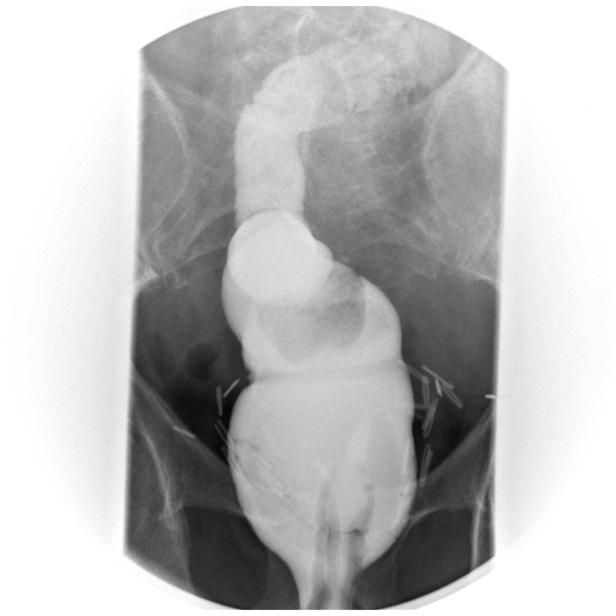

[Series 2: run · 1 of 1 slices shown (2 of 13)]
[im 1/1]
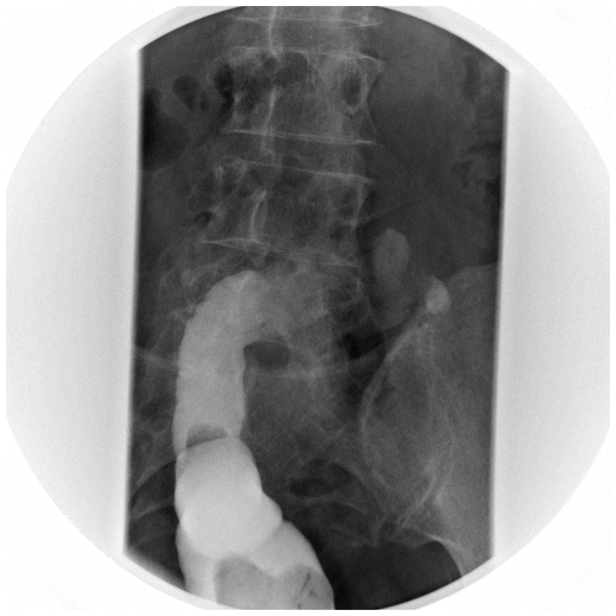

[Series 3: run · 1 of 1 slices shown (3 of 13)]
[im 1/1]
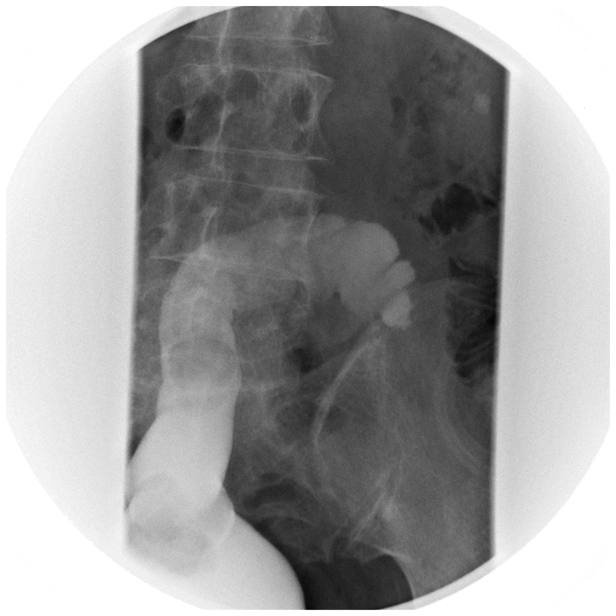

[Series 4: run · 1 of 1 slices shown (4 of 13)]
[im 1/1]
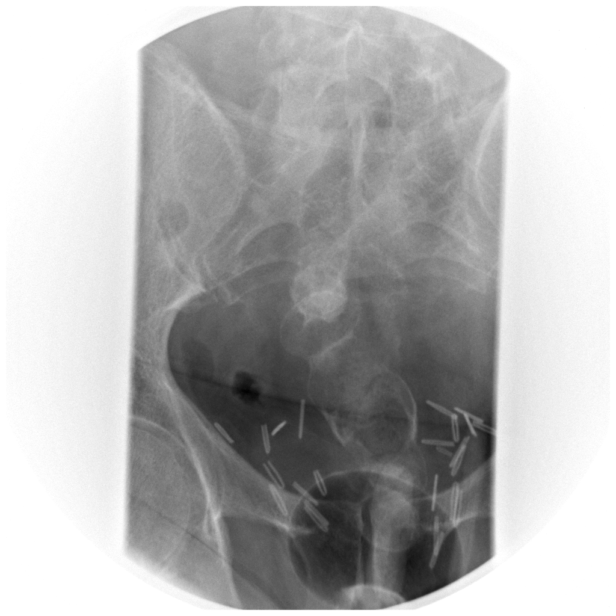

[Series 5: run · 1 of 1 slices shown (5 of 13)]
[im 1/1]
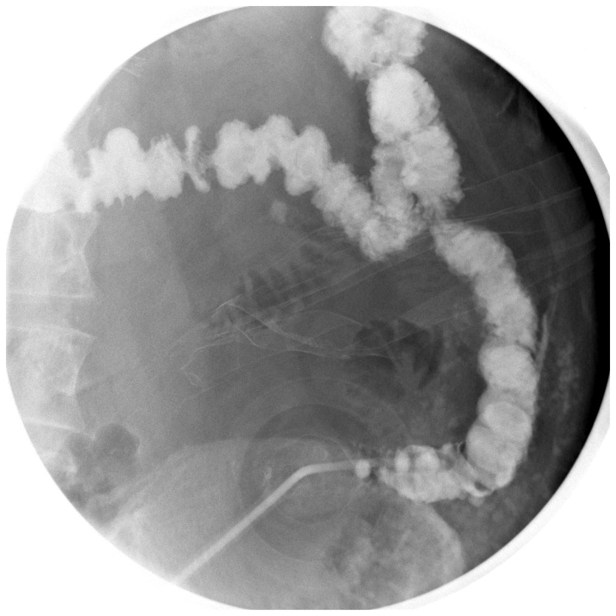

[Series 6: run · 1 of 1 slices shown (6 of 13)]
[im 1/1]
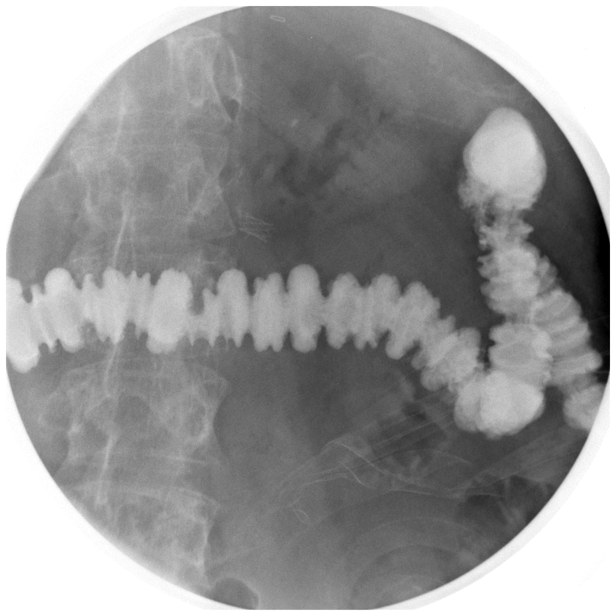

[Series 7: run · 1 of 1 slices shown (7 of 13)]
[im 1/1]
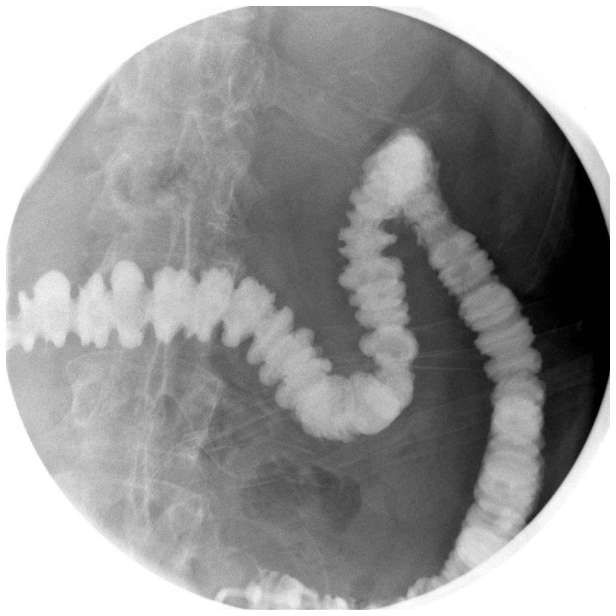

[Series 8: run · 1 of 1 slices shown (8 of 13)]
[im 1/1]
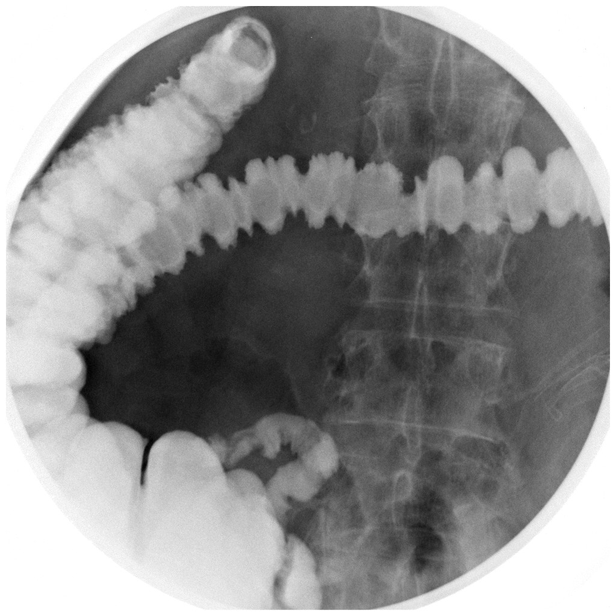

[Series 9: run · 1 of 1 slices shown (9 of 13)]
[im 1/1]
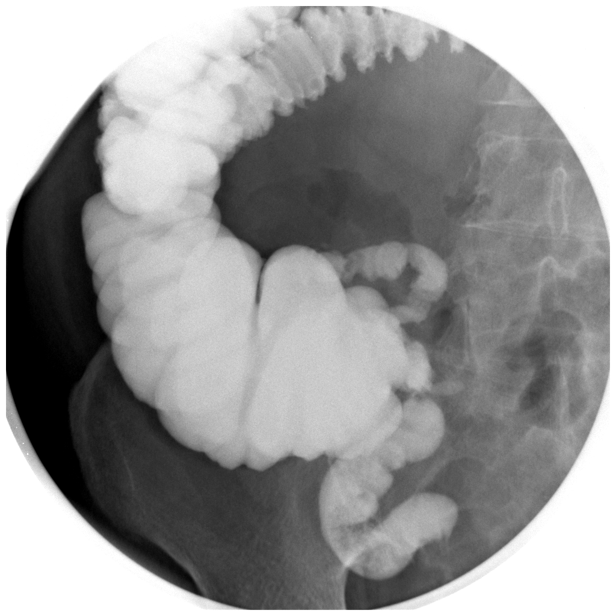

[Series 10: run · 1 of 1 slices shown (10 of 13)]
[im 1/1]
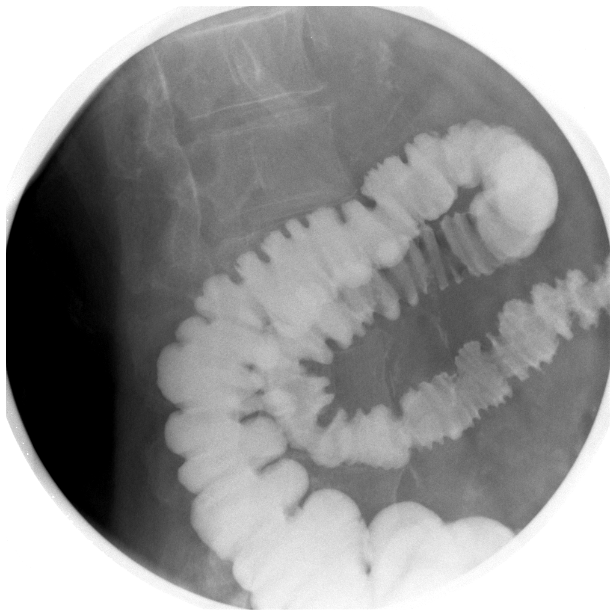

[Series 11: run · 1 of 1 slices shown (11 of 13)]
[im 1/1]
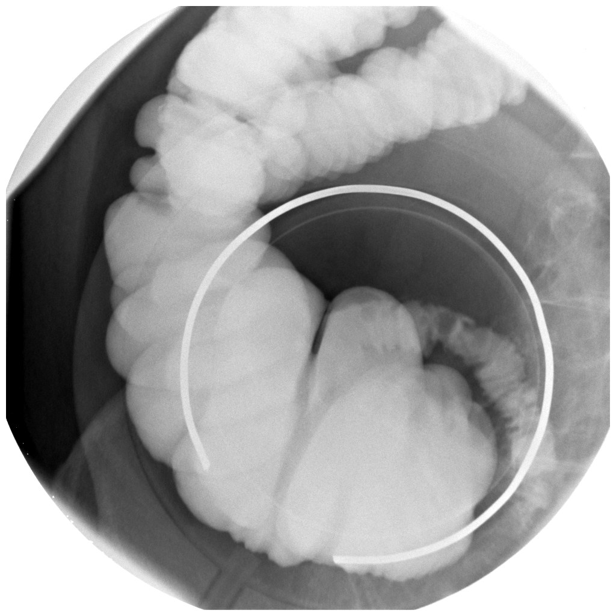

[Series 12: run · 1 of 1 slices shown (12 of 13)]
[im 1/1]
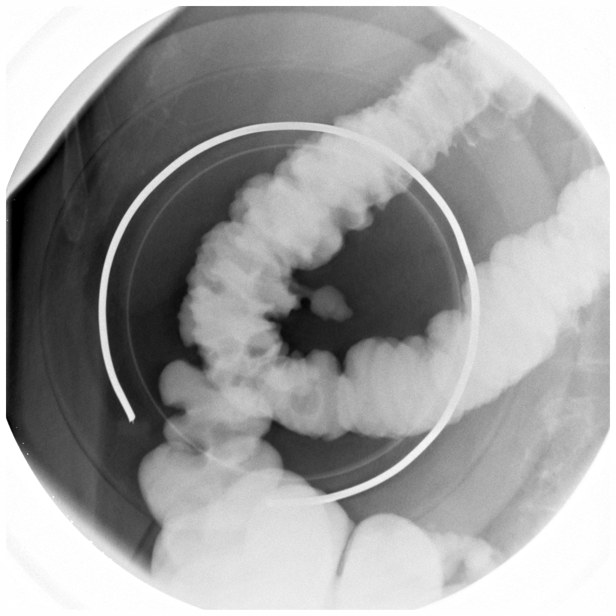

[Series 13: run · 1 of 1 slices shown (13 of 13)]
[im 1/1]
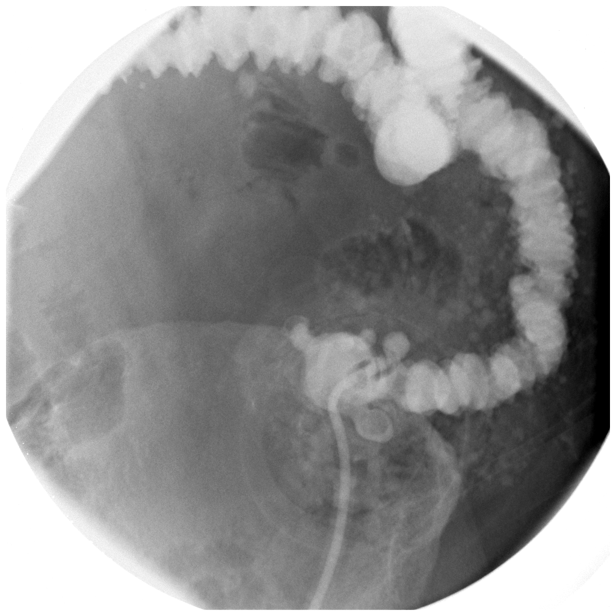

[13 of 13 positions shown; findings below may reference images not displayed]

FINDINGS: Preliminary scout AP supine abdominal image demonstrates
a normal bowel gas pattern.  Surgical clips in the pelvis from
prior prostatectomy and node dissection.  Approximate 9 mm calculus
in a lower pole calix of the left kidney, unchanged.  Descending
colostomy noted.

Initial evaluation of the Houlda Raison showed it to be normal in
appearance.  No diverticula involving the distal sigmoid colon.
Post-drainage image unremarkable.

Colostomy enema demonstrates diverticulosis involving the distal
descending and proximal sigmoid colon near the ostomy.
Diverticular though a solitary diverticulum is identified arising
from the mid descending colon.  No masses or polyps of greater than
1 cm identified during fluoroscopic evaluation with manual
compression.  The ileocecal valve is incompetent, allowing reflux
into the normal appearing terminal and distal ileum.  Post-drainage
image demonstrates a normal mucosal pattern.
IMPRESSION: 1.  Monthusi Squa Jinya.
2.  Diverticulosis involving the distal descending colon and
proximal sigmoid colon near the ostomy.  Solitary diverticulum
involving the mid ascending colon.
3.  No colonic masses or polyps of greater than 1 cm.
4.  Incompetent ileocecal valve.  Normal-appearing terminal and
distal ileum.

Preliminary results were discussed with the patient at the time of
the examination.

## 2013-04-29 ENCOUNTER — Other Ambulatory Visit: Payer: Self-pay | Admitting: Cardiology

## 2013-05-02 ENCOUNTER — Encounter: Payer: Self-pay | Admitting: Cardiology

## 2013-05-10 ENCOUNTER — Encounter: Payer: Self-pay | Admitting: Cardiology

## 2013-05-10 ENCOUNTER — Ambulatory Visit (INDEPENDENT_AMBULATORY_CARE_PROVIDER_SITE_OTHER): Payer: Medicare Other | Admitting: Cardiology

## 2013-05-10 ENCOUNTER — Encounter (INDEPENDENT_AMBULATORY_CARE_PROVIDER_SITE_OTHER): Payer: Self-pay

## 2013-05-10 VITALS — BP 150/78 | HR 72 | Ht 66.0 in | Wt 153.0 lb

## 2013-05-10 DIAGNOSIS — I5022 Chronic systolic (congestive) heart failure: Secondary | ICD-10-CM

## 2013-05-10 DIAGNOSIS — I701 Atherosclerosis of renal artery: Secondary | ICD-10-CM

## 2013-05-10 DIAGNOSIS — I2589 Other forms of chronic ischemic heart disease: Secondary | ICD-10-CM

## 2013-05-10 DIAGNOSIS — E785 Hyperlipidemia, unspecified: Secondary | ICD-10-CM

## 2013-05-10 DIAGNOSIS — I493 Ventricular premature depolarization: Secondary | ICD-10-CM

## 2013-05-10 DIAGNOSIS — I255 Ischemic cardiomyopathy: Secondary | ICD-10-CM

## 2013-05-10 DIAGNOSIS — I10 Essential (primary) hypertension: Secondary | ICD-10-CM

## 2013-05-10 DIAGNOSIS — I4891 Unspecified atrial fibrillation: Secondary | ICD-10-CM

## 2013-05-10 DIAGNOSIS — I4949 Other premature depolarization: Secondary | ICD-10-CM

## 2013-05-10 DIAGNOSIS — I2510 Atherosclerotic heart disease of native coronary artery without angina pectoris: Secondary | ICD-10-CM

## 2013-05-10 NOTE — Assessment & Plan Note (Signed)
Continue aspirin. Intolerant to statins. 

## 2013-05-10 NOTE — Assessment & Plan Note (Signed)
This has been included from previous problem list. However I can find no documentation of this and he states he may have had this following his bypass surgery but otherwise has not.

## 2013-05-10 NOTE — Assessment & Plan Note (Signed)
Blood pressure is mildly elevated but typically controlled. Continue present medications and follow. 

## 2013-05-10 NOTE — Assessment & Plan Note (Signed)
Continue beta blocker. 

## 2013-05-10 NOTE — Assessment & Plan Note (Signed)
Continue diet. Intolerant to statins. 

## 2013-05-10 NOTE — Assessment & Plan Note (Signed)
Continue aspirin 

## 2013-05-10 NOTE — Progress Notes (Signed)
HPI: 72 year old male previously followed by Dr. Daleen Squibb for followup of coronary artery disease and atrial fibrillation. He has had prior PCI of circumflex and RCA. He presented to [facility] in 11/11 with that and STEMI and underwent CABG with a LIMA to the LAD, SVG to the OM, SVG to PDA. This was complicated by perforated viscus from diverticulitis and he underwent colectomy with colostomy that was subsequently taken down. Followup cardiac cath in 03/2011 showed patent grafts and severe LV dysfunction EF 30%. Previously placed stent in left renal artery was patent. He also has history of PVC's, renal insufficiency status post stenting for renal artery stenosis, prior stroke, hypertension, and merkel cell cancer of the scalp treated at [facility] with surgery,radiation. Carotid Dopplers in May 2014 showed 40-59% bilateral ICA stenosis. Followup in 1 year. Echocardiogram in October of 2014 showed an ejection fraction of 20-25%. There was restrictive filling. There was moderate left atrial enlargement and right atrial enlargement. The right ventricle was mildly dilated. Mild mitral and trace aortic insufficiency. Patient last seen in September of 2014. Since then, denies chest pain or syncope. No dyspnea. He does have fatigue. Chronic edema right lower extremity.   Current Outpatient Prescriptions  Medication Sig Dispense Refill  . allopurinol (ZYLOPRIM) 300 MG tablet Take 300 mg by mouth daily.      Marland Kitchen aspirin EC 81 MG tablet Take 81 mg by mouth daily.        . carvedilol (COREG) 12.5 MG tablet TAKE ONE & ONE-HALF TABLETS BY MOUTH TWICE DAILY  90 tablet  0  . clopidogrel (PLAVIX) 75 MG tablet Take 1 tablet (75 mg total) by mouth daily.  90 tablet  3  . colchicine 0.6 MG tablet Take 0.6 mg by mouth as needed.      Marland Kitchen FLUoxetine (PROZAC) 10 MG tablet Take 10 mg by mouth daily.      . furosemide (LASIX) 40 MG tablet TAKE TWO TABLETS BY MOUTH ONCE DAILY  60 tablet  1  . guaiFENesin (MUCINEX) 600 MG 12  hr tablet Take 400 mg by mouth 2 (two) times daily.       . hydrALAZINE (APRESOLINE) 25 MG tablet Take 25 mg by mouth 3 (three) times daily.      . isosorbide dinitrate (ISORDIL) 20 MG tablet TAKE ONE TABLET BY MOUTH THREE TIMES DAILY  90 tablet  5  . lansoprazole (PREVACID) 30 MG capsule Take 30 mg by mouth daily.        . nitroGLYCERIN (NITROSTAT) 0.4 MG SL tablet Place 0.4 mg under the tongue every 5 (five) minutes as needed.        . saxagliptin HCl (ONGLYZA) 2.5 MG TABS tablet Take 2.5 mg by mouth daily.      . valsartan (DIOVAN) 320 MG tablet Take 320 mg by mouth daily.        No current facility-administered medications for this visit.     Past Medical History  Diagnosis Date  . Hyperlipidemia   . Hypertension   . CAD (coronary artery disease)     NSTEMI 11/11 -  cath: EF 40%, pLAD 30%, 90%, 30-40%, CFX stent occluded, pRCA 30%, dRCA stent ok, pPDA 40%, 30%;  s/p CABG 11/11;LHC 04/01/11: EF 30%, left RA with 30% ISR, right RA patent, RCA stent patent, severe disease in LAD and CFX, L-LAD, S-PDA, S-OM ok    . Prostate cancer     2006, s/p radical prostatectomy at [facility]  .  Stroke     2007  . Injury of sigmoid colon     perforation requiring hemicolectomy and colostomy with subsequent takedown  . Renal artery stenosis     s/p prior ptca  . Chronic systolic heart failure     echo 9/12: Mild LVH, EF 25%, restrictive diast dysfxn with elevated filling pressures, mild AI, mild enlarged Ao root 36 mm, mild MR, mod LAE, mild RVE, mod reduced RVSF, mild to mod PR, PASP 62-66 (mod pulmonary HTN), mod pleural effusion;  echo 05/2011: Mod LVH, EF 40-45%, mild AI, mild LAE, PASP 36  . Ischemic cardiomyopathy   . Carotid stenosis     bilat 40-59% in 8/11  . Diabetes mellitus   . Acute myocardial infarction, subendocardial infarction, subsequent episode of care   . Depression   . Gout   . Anxiety   . Osteoarthritis   . PVC's (premature ventricular contractions)     RBB R superior  axis PVCs frequently in bigeminy    Past Surgical History  Procedure Laterality Date  . Colon surgery    . Prostate surgery    . Coronary artery bypass graft      History   Social History  . Marital Status: Married    Spouse Name: N/A    Number of Children: N/A  . Years of Education: N/A   Occupational History  . Not on file.   Social History Main Topics  . Smoking status: Former Smoker -- 2.00 packs/day for 20 years    Types: Cigarettes    Quit date: 02/08/1977  . Smokeless tobacco: Never Used  . Alcohol Use: No  . Drug Use: No  . Sexual Activity: Not on file   Other Topics Concern  . Not on file   Social History Narrative   Pt lives in [place] with spouse.  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 [address], [place]    ROS: fatigue but no fevers or chills, productive cough, hemoptysis, dysphasia, odynophagia, melena, hematochezia, dysuria, hematuria, rash, seizure activity, orthopnea, PND, claudication. Remaining systems are negative.  Physical Exam: Well-developed well-nourished in no acute distress.  Skin is warm and dry.  HEENT alopecia Neck is supple.  Chest is clear to auscultation with normal expansion.  Cardiovascular exam is regular rate and rhythm. 2/6 diastolic murmur Abdominal exam nontender or distended. No masses palpated. Extremities show 1+ edema right lower extremity. neuro grossly intact

## 2013-05-10 NOTE — Assessment & Plan Note (Signed)
Patient euvolemic on examination. Continue present dose of Lasix. 

## 2013-05-10 NOTE — Assessment & Plan Note (Signed)
Continue aspirin. Followup carotid Dopplers May 2015.

## 2013-05-10 NOTE — Patient Instructions (Signed)
Your physician wants you to follow-up in: 6 MONTHS WITH DR CRENSHAW You will receive a reminder letter in the mail two months in advance. If you don't receive a letter, please call our office to schedule the follow-up appointment.  

## 2013-05-10 NOTE — Assessment & Plan Note (Signed)
Continue ARB and beta blocker. I have considered ICD. However he recently underwent radiation therapy for Merkel cell tumor. I would like to make sure this has completely resolved prior to considering ICD.

## 2013-05-24 ENCOUNTER — Other Ambulatory Visit: Payer: Self-pay

## 2013-05-24 MED ORDER — FUROSEMIDE 40 MG PO TABS: ORAL_TABLET | ORAL | Status: DC

## 2013-05-27 ENCOUNTER — Other Ambulatory Visit: Payer: Self-pay | Admitting: Cardiology

## 2013-07-22 ENCOUNTER — Other Ambulatory Visit: Payer: Self-pay | Admitting: Cardiology

## 2013-08-19 ENCOUNTER — Other Ambulatory Visit: Payer: Self-pay | Admitting: Cardiology

## 2013-08-23 ENCOUNTER — Encounter: Payer: Self-pay | Admitting: Cardiology

## 2013-08-27 ENCOUNTER — Telehealth: Payer: Self-pay | Admitting: Cardiology

## 2013-08-27 NOTE — Telephone Encounter (Addendum)
Spoke with pt, he had his physical Friday and was 100%. Saturday he started feeling like he had no energy. He does have some body aches, no fever. He does have a little SOB that is new when he moves through the house. He denies swelling in his feet and ankles. He is up 6 lbs in several weeks. He thinks his abdomen might be bigger than normal. No problems when lying flat. He reports no appetite. He reports a new cough of dark brown with maybe a little yellow in it. He is afraid of having heart failure again because it was so bad last time. No trouble lying flat. Will discuss with lori gerhardt np and call the pt back. Pt agreed with this plan.

## 2013-08-27 NOTE — Telephone Encounter (Signed)
New message         Pt is not feeling well and would like to come in today to be checked. He is aware that Stanford Breed is not here today.

## 2013-08-27 NOTE — Telephone Encounter (Signed)
Discussed with lori gerhardt np, pt instructed to call his PCP, he may have a little heart failure but it sounds like he may have the flu. Patient voiced understanding

## 2013-09-15 ENCOUNTER — Inpatient Hospital Stay (HOSPITAL_COMMUNITY)
Admission: EM | Admit: 2013-09-15 | Discharge: 2013-09-19 | DRG: 193 | Disposition: A | Discharge status: Home Health | Payer: Medicare Other | Attending: Internal Medicine | Admitting: Internal Medicine

## 2013-09-15 ENCOUNTER — Encounter (HOSPITAL_COMMUNITY): Payer: Self-pay | Admitting: Emergency Medicine

## 2013-09-15 ENCOUNTER — Emergency Department (HOSPITAL_COMMUNITY): Payer: Medicare Other

## 2013-09-15 DIAGNOSIS — F329 Major depressive disorder, single episode, unspecified: Secondary | ICD-10-CM | POA: Diagnosis present

## 2013-09-15 DIAGNOSIS — R609 Edema, unspecified: Secondary | ICD-10-CM

## 2013-09-15 DIAGNOSIS — R002 Palpitations: Secondary | ICD-10-CM

## 2013-09-15 DIAGNOSIS — I2589 Other forms of chronic ischemic heart disease: Secondary | ICD-10-CM | POA: Diagnosis present

## 2013-09-15 DIAGNOSIS — Z9861 Coronary angioplasty status: Secondary | ICD-10-CM

## 2013-09-15 DIAGNOSIS — Z7982 Long term (current) use of aspirin: Secondary | ICD-10-CM

## 2013-09-15 DIAGNOSIS — R042 Hemoptysis: Secondary | ICD-10-CM

## 2013-09-15 DIAGNOSIS — I6529 Occlusion and stenosis of unspecified carotid artery: Secondary | ICD-10-CM

## 2013-09-15 DIAGNOSIS — F411 Generalized anxiety disorder: Secondary | ICD-10-CM | POA: Diagnosis present

## 2013-09-15 DIAGNOSIS — I5022 Chronic systolic (congestive) heart failure: Secondary | ICD-10-CM

## 2013-09-15 DIAGNOSIS — R05 Cough: Secondary | ICD-10-CM

## 2013-09-15 DIAGNOSIS — I2510 Atherosclerotic heart disease of native coronary artery without angina pectoris: Secondary | ICD-10-CM | POA: Diagnosis present

## 2013-09-15 DIAGNOSIS — Z7901 Long term (current) use of anticoagulants: Secondary | ICD-10-CM

## 2013-09-15 DIAGNOSIS — Z01810 Encounter for preprocedural cardiovascular examination: Secondary | ICD-10-CM

## 2013-09-15 DIAGNOSIS — C4A9 Merkel cell carcinoma, unspecified: Secondary | ICD-10-CM

## 2013-09-15 DIAGNOSIS — I701 Atherosclerosis of renal artery: Secondary | ICD-10-CM

## 2013-09-15 DIAGNOSIS — F3289 Other specified depressive episodes: Secondary | ICD-10-CM | POA: Diagnosis present

## 2013-09-15 DIAGNOSIS — E119 Type 2 diabetes mellitus without complications: Secondary | ICD-10-CM | POA: Diagnosis present

## 2013-09-15 DIAGNOSIS — D509 Iron deficiency anemia, unspecified: Secondary | ICD-10-CM | POA: Diagnosis present

## 2013-09-15 DIAGNOSIS — I10 Essential (primary) hypertension: Secondary | ICD-10-CM | POA: Diagnosis present

## 2013-09-15 DIAGNOSIS — I255 Ischemic cardiomyopathy: Secondary | ICD-10-CM | POA: Diagnosis present

## 2013-09-15 DIAGNOSIS — I4891 Unspecified atrial fibrillation: Secondary | ICD-10-CM | POA: Diagnosis present

## 2013-09-15 DIAGNOSIS — Z87891 Personal history of nicotine dependence: Secondary | ICD-10-CM

## 2013-09-15 DIAGNOSIS — Z833 Family history of diabetes mellitus: Secondary | ICD-10-CM

## 2013-09-15 DIAGNOSIS — I493 Ventricular premature depolarization: Secondary | ICD-10-CM

## 2013-09-15 DIAGNOSIS — Z8249 Family history of ischemic heart disease and other diseases of the circulatory system: Secondary | ICD-10-CM

## 2013-09-15 DIAGNOSIS — N184 Chronic kidney disease, stage 4 (severe): Secondary | ICD-10-CM | POA: Diagnosis present

## 2013-09-15 DIAGNOSIS — Z8546 Personal history of malignant neoplasm of prostate: Secondary | ICD-10-CM

## 2013-09-15 DIAGNOSIS — Z8673 Personal history of transient ischemic attack (TIA), and cerebral infarction without residual deficits: Secondary | ICD-10-CM

## 2013-09-15 DIAGNOSIS — Z951 Presence of aortocoronary bypass graft: Secondary | ICD-10-CM

## 2013-09-15 DIAGNOSIS — J189 Pneumonia, unspecified organism: Secondary | ICD-10-CM | POA: Diagnosis present

## 2013-09-15 DIAGNOSIS — Z882 Allergy status to sulfonamides status: Secondary | ICD-10-CM

## 2013-09-15 DIAGNOSIS — D696 Thrombocytopenia, unspecified: Secondary | ICD-10-CM | POA: Diagnosis present

## 2013-09-15 DIAGNOSIS — Z66 Do not resuscitate: Secondary | ICD-10-CM | POA: Diagnosis present

## 2013-09-15 DIAGNOSIS — M109 Gout, unspecified: Secondary | ICD-10-CM | POA: Diagnosis present

## 2013-09-15 DIAGNOSIS — R5383 Other fatigue: Secondary | ICD-10-CM

## 2013-09-15 DIAGNOSIS — I5023 Acute on chronic systolic (congestive) heart failure: Secondary | ICD-10-CM | POA: Diagnosis present

## 2013-09-15 DIAGNOSIS — Z79899 Other long term (current) drug therapy: Secondary | ICD-10-CM

## 2013-09-15 DIAGNOSIS — R059 Cough, unspecified: Secondary | ICD-10-CM

## 2013-09-15 DIAGNOSIS — E785 Hyperlipidemia, unspecified: Secondary | ICD-10-CM | POA: Diagnosis present

## 2013-09-15 DIAGNOSIS — M1990 Unspecified osteoarthritis, unspecified site: Secondary | ICD-10-CM | POA: Diagnosis present

## 2013-09-15 DIAGNOSIS — I129 Hypertensive chronic kidney disease with stage 1 through stage 4 chronic kidney disease, or unspecified chronic kidney disease: Secondary | ICD-10-CM | POA: Diagnosis present

## 2013-09-15 DIAGNOSIS — I509 Heart failure, unspecified: Secondary | ICD-10-CM | POA: Diagnosis present

## 2013-09-15 DIAGNOSIS — I252 Old myocardial infarction: Secondary | ICD-10-CM

## 2013-09-15 LAB — I-STAT TROPONIN, ED: TROPONIN I, POC: 0.01 ng/mL (ref 0.00–0.08)

## 2013-09-15 MED ORDER — SODIUM CHLORIDE 0.9 % IV BOLUS (SEPSIS)
500.0000 mL | Freq: Once | INTRAVENOUS | Status: DC
Start: 2013-09-16 — End: 2013-09-16

## 2013-09-15 NOTE — ED Notes (Signed)
Presents with worsening cough and hemoptysis associated with fever, weakness, SOB, and pallor. Cough began 3 weeks ago, was placed on antibiotic and steroids with no relief. Pt is pale and weak, unable to stand up on own which is change from baseline. Reports moments of confusion over the last day. Denies diarrhea. Temp 99.2, reports low grade fevers of 100 at home. Sats 95% RA, shallow respirations, bilateral breath sounds diminished.

## 2013-09-16 ENCOUNTER — Encounter (HOSPITAL_COMMUNITY): Payer: Self-pay | Admitting: Internal Medicine

## 2013-09-16 DIAGNOSIS — I509 Heart failure, unspecified: Secondary | ICD-10-CM

## 2013-09-16 DIAGNOSIS — I5023 Acute on chronic systolic (congestive) heart failure: Secondary | ICD-10-CM

## 2013-09-16 DIAGNOSIS — I4891 Unspecified atrial fibrillation: Secondary | ICD-10-CM

## 2013-09-16 DIAGNOSIS — N184 Chronic kidney disease, stage 4 (severe): Secondary | ICD-10-CM

## 2013-09-16 DIAGNOSIS — I369 Nonrheumatic tricuspid valve disorder, unspecified: Secondary | ICD-10-CM

## 2013-09-16 DIAGNOSIS — R042 Hemoptysis: Secondary | ICD-10-CM | POA: Diagnosis present

## 2013-09-16 DIAGNOSIS — E119 Type 2 diabetes mellitus without complications: Secondary | ICD-10-CM | POA: Diagnosis present

## 2013-09-16 DIAGNOSIS — J189 Pneumonia, unspecified organism: Principal | ICD-10-CM

## 2013-09-16 LAB — BASIC METABOLIC PANEL
BUN: 39 mg/dL — ABNORMAL HIGH (ref 6–23)
CHLORIDE: 103 meq/L (ref 96–112)
CO2: 22 mEq/L (ref 19–32)
Calcium: 9.3 mg/dL (ref 8.4–10.5)
Creatinine, Ser: 1.51 mg/dL — ABNORMAL HIGH (ref 0.50–1.35)
GFR, EST AFRICAN AMERICAN: 52 mL/min — AB (ref >90)
GFR, EST NON AFRICAN AMERICAN: 45 mL/min — AB (ref >90)
Glucose, Bld: 150 mg/dL — ABNORMAL HIGH (ref 70–99)
POTASSIUM: 4.5 meq/L (ref 3.7–5.3)
SODIUM: 143 meq/L (ref 137–147)

## 2013-09-16 LAB — CBC WITH DIFFERENTIAL/PLATELET
BASOS ABS: 0 10*3/uL (ref 0.0–0.1)
Basophils Relative: 0 % (ref 0–1)
Eosinophils Absolute: 0 10*3/uL (ref 0.0–0.7)
Eosinophils Relative: 0 % (ref 0–5)
HEMATOCRIT: 32.1 % — AB (ref 39.0–52.0)
Hemoglobin: 10 g/dL — ABNORMAL LOW (ref 13.0–17.0)
LYMPHS PCT: 4 % — AB (ref 12–46)
Lymphs Abs: 0.5 10*3/uL — ABNORMAL LOW (ref 0.7–4.0)
MCH: 23.7 pg — ABNORMAL LOW (ref 26.0–34.0)
MCHC: 31.2 g/dL (ref 30.0–36.0)
MCV: 76.1 fL — ABNORMAL LOW (ref 78.0–100.0)
MONO ABS: 0.5 10*3/uL (ref 0.1–1.0)
Monocytes Relative: 4 % (ref 3–12)
Neutro Abs: 13 10*3/uL — ABNORMAL HIGH (ref 1.7–7.7)
Neutrophils Relative %: 93 % — ABNORMAL HIGH (ref 43–77)
PLATELETS: 102 10*3/uL — AB (ref 150–400)
RBC: 4.22 MIL/uL (ref 4.22–5.81)
RDW: 18.4 % — AB (ref 11.5–15.5)
WBC: 14 10*3/uL — AB (ref 4.0–10.5)

## 2013-09-16 LAB — I-STAT CG4 LACTIC ACID, ED: LACTIC ACID, VENOUS: 1.89 mmol/L (ref 0.5–2.2)

## 2013-09-16 LAB — TROPONIN I
Troponin I: <0.3 ng/mL (ref <0.30)
Troponin I: <0.3 ng/mL (ref <0.30)

## 2013-09-16 LAB — COMPREHENSIVE METABOLIC PANEL
ALBUMIN: 3.3 g/dL — AB (ref 3.5–5.2)
ALT: 17 U/L (ref 0–53)
AST: 15 U/L (ref 0–37)
Alkaline Phosphatase: 98 U/L (ref 39–117)
BUN: 40 mg/dL — ABNORMAL HIGH (ref 6–23)
CALCIUM: 9.3 mg/dL (ref 8.4–10.5)
CO2: 20 mEq/L (ref 19–32)
CREATININE: 1.49 mg/dL — AB (ref 0.50–1.35)
Chloride: 102 mEq/L (ref 96–112)
GFR calc Af Amer: 53 mL/min — ABNORMAL LOW (ref >90)
GFR calc non Af Amer: 45 mL/min — ABNORMAL LOW (ref >90)
Glucose, Bld: 137 mg/dL — ABNORMAL HIGH (ref 70–99)
Potassium: 4.4 mEq/L (ref 3.7–5.3)
SODIUM: 141 meq/L (ref 137–147)
TOTAL PROTEIN: 6.2 g/dL (ref 6.0–8.3)
Total Bilirubin: 1.4 mg/dL — ABNORMAL HIGH (ref 0.3–1.2)

## 2013-09-16 LAB — CBC
HCT: 33.2 % — ABNORMAL LOW (ref 39.0–52.0)
Hemoglobin: 10.4 g/dL — ABNORMAL LOW (ref 13.0–17.0)
MCH: 23.9 pg — ABNORMAL LOW (ref 26.0–34.0)
MCHC: 31.3 g/dL (ref 30.0–36.0)
MCV: 76.1 fL — ABNORMAL LOW (ref 78.0–100.0)
PLATELETS: 101 10*3/uL — AB (ref 150–400)
RBC: 4.36 MIL/uL (ref 4.22–5.81)
RDW: 18.2 % — AB (ref 11.5–15.5)
WBC: 13.5 10*3/uL — AB (ref 4.0–10.5)

## 2013-09-16 LAB — TSH: TSH: 1.608 u[IU]/mL (ref 0.350–4.500)

## 2013-09-16 LAB — LEGIONELLA ANTIGEN, URINE: LEGIONELLA ANTIGEN, URINE: NEGATIVE

## 2013-09-16 LAB — GLUCOSE, CAPILLARY
GLUCOSE-CAPILLARY: 137 mg/dL — AB (ref 70–99)
GLUCOSE-CAPILLARY: 168 mg/dL — AB (ref 70–99)
Glucose-Capillary: 144 mg/dL — ABNORMAL HIGH (ref 70–99)
Glucose-Capillary: 131 mg/dL — ABNORMAL HIGH (ref 70–99)

## 2013-09-16 LAB — PROTIME-INR
INR: 1.85 — ABNORMAL HIGH (ref 0.00–1.49)
PROTHROMBIN TIME: 20.8 s — AB (ref 11.6–15.2)

## 2013-09-16 LAB — PRO B NATRIURETIC PEPTIDE: PRO B NATRI PEPTIDE: 23918 pg/mL — AB (ref 0–125)

## 2013-09-16 LAB — STREP PNEUMONIAE URINARY ANTIGEN: STREP PNEUMO URINARY ANTIGEN: POSITIVE — AB

## 2013-09-16 LAB — PROCALCITONIN: PROCALCITONIN: 4.39 ng/mL

## 2013-09-16 MED ORDER — HYDRALAZINE HCL 25 MG PO TABS
25.0000 mg | ORAL_TABLET | Freq: Three times a day (TID) | ORAL | Status: DC
  Administered 2013-09-16 – 2013-09-19 (×10): 25 mg via ORAL
  Filled 2013-09-16 (×12): qty 1

## 2013-09-16 MED ORDER — ISOSORBIDE DINITRATE 20 MG PO TABS
20.0000 mg | ORAL_TABLET | Freq: Three times a day (TID) | ORAL | Status: DC
  Administered 2013-09-16 – 2013-09-19 (×10): 20 mg via ORAL
  Filled 2013-09-16 (×12): qty 1

## 2013-09-16 MED ORDER — DEXTROSE 5 % IV SOLN
500.0000 mg | INTRAVENOUS | Status: DC
  Administered 2013-09-16: 500 mg via INTRAVENOUS
  Filled 2013-09-16: qty 500

## 2013-09-16 MED ORDER — CARVEDILOL 6.25 MG PO TABS
18.0000 mg | ORAL_TABLET | Freq: Two times a day (BID) | ORAL | Status: DC
  Administered 2013-09-16 – 2013-09-19 (×8): 18.75 mg via ORAL
  Filled 2013-09-16 (×11): qty 1

## 2013-09-16 MED ORDER — POTASSIUM CHLORIDE CRYS ER 20 MEQ PO TBCR
40.0000 meq | EXTENDED_RELEASE_TABLET | Freq: Two times a day (BID) | ORAL | Status: DC
  Administered 2013-09-16 – 2013-09-19 (×7): 40 meq via ORAL
  Filled 2013-09-16 (×9): qty 2

## 2013-09-16 MED ORDER — ASPIRIN EC 81 MG PO TBEC
81.0000 mg | DELAYED_RELEASE_TABLET | Freq: Every day | ORAL | Status: DC
  Administered 2013-09-16 – 2013-09-19 (×4): 81 mg via ORAL
  Filled 2013-09-16 (×4): qty 1

## 2013-09-16 MED ORDER — SODIUM CHLORIDE 0.9 % IJ SOLN: 3.0000 mL | INTRAMUSCULAR | Status: DC | PRN

## 2013-09-16 MED ORDER — AZITHROMYCIN 250 MG PO TABS
500.0000 mg | ORAL_TABLET | Freq: Once | ORAL | Status: AC
  Administered 2013-09-16: 500 mg via ORAL
  Filled 2013-09-16: qty 2

## 2013-09-16 MED ORDER — DEXTROSE 5 % IV SOLN
1.0000 g | Freq: Once | INTRAVENOUS | Status: AC
  Administered 2013-09-16: 1 g via INTRAVENOUS
  Filled 2013-09-16: qty 10

## 2013-09-16 MED ORDER — INSULIN ASPART 100 UNIT/ML ~~LOC~~ SOLN: 0.0000 [IU] | Freq: Every day | SUBCUTANEOUS | Status: DC

## 2013-09-16 MED ORDER — COLCHICINE 0.6 MG PO TABS
0.6000 mg | ORAL_TABLET | Freq: Every day | ORAL | Status: DC
  Administered 2013-09-16 – 2013-09-19 (×4): 0.6 mg via ORAL
  Filled 2013-09-16 (×4): qty 1

## 2013-09-16 MED ORDER — SODIUM CHLORIDE 0.9 % IV SOLN: 250.0000 mL | INTRAVENOUS | Status: DC | PRN

## 2013-09-16 MED ORDER — FLUOXETINE HCL 20 MG PO TABS
10.0000 mg | ORAL_TABLET | Freq: Every day | ORAL | Status: DC
  Administered 2013-09-16 – 2013-09-17 (×2): 10 mg via ORAL
  Filled 2013-09-16 (×4): qty 1

## 2013-09-16 MED ORDER — FUROSEMIDE 10 MG/ML IJ SOLN
40.0000 mg | Freq: Two times a day (BID) | INTRAMUSCULAR | Status: DC
  Administered 2013-09-16 – 2013-09-18 (×6): 40 mg via INTRAVENOUS
  Filled 2013-09-16 (×9): qty 4

## 2013-09-16 MED ORDER — INSULIN ASPART 100 UNIT/ML ~~LOC~~ SOLN
0.0000 [IU] | Freq: Three times a day (TID) | SUBCUTANEOUS | Status: DC
  Administered 2013-09-16 – 2013-09-17 (×5): 2 [IU] via SUBCUTANEOUS
  Administered 2013-09-18: 3 [IU] via SUBCUTANEOUS
  Administered 2013-09-18 – 2013-09-19 (×4): 2 [IU] via SUBCUTANEOUS

## 2013-09-16 MED ORDER — DEXTROSE 5 % IV SOLN
1.0000 g | INTRAVENOUS | Status: DC
  Administered 2013-09-16 – 2013-09-18 (×3): 1 g via INTRAVENOUS
  Filled 2013-09-16 (×4): qty 10

## 2013-09-16 MED ORDER — ONDANSETRON HCL 4 MG/2ML IJ SOLN: 4.0000 mg | Freq: Three times a day (TID) | INTRAMUSCULAR | Status: DC | PRN

## 2013-09-16 MED ORDER — ALLOPURINOL 300 MG PO TABS
300.0000 mg | ORAL_TABLET | Freq: Every day | ORAL | Status: DC
  Administered 2013-09-16 – 2013-09-19 (×4): 300 mg via ORAL
  Filled 2013-09-16 (×4): qty 1

## 2013-09-16 MED ORDER — PANTOPRAZOLE SODIUM 40 MG PO TBEC
40.0000 mg | DELAYED_RELEASE_TABLET | Freq: Every day | ORAL | Status: DC
  Administered 2013-09-16 – 2013-09-19 (×4): 40 mg via ORAL
  Filled 2013-09-16 (×3): qty 1

## 2013-09-16 MED ORDER — GUAIFENESIN 200 MG PO TABS
400.0000 mg | ORAL_TABLET | Freq: Two times a day (BID) | ORAL | Status: DC
  Administered 2013-09-16 – 2013-09-19 (×7): 400 mg via ORAL
  Filled 2013-09-16 (×8): qty 2

## 2013-09-16 MED ORDER — SODIUM CHLORIDE 0.9 % IJ SOLN
3.0000 mL | Freq: Two times a day (BID) | INTRAMUSCULAR | Status: DC
  Administered 2013-09-16 – 2013-09-19 (×7): 3 mL via INTRAVENOUS

## 2013-09-16 MED ORDER — IRBESARTAN 300 MG PO TABS
300.0000 mg | ORAL_TABLET | Freq: Every day | ORAL | Status: DC
  Administered 2013-09-16 – 2013-09-19 (×4): 300 mg via ORAL
  Filled 2013-09-16 (×4): qty 1

## 2013-09-16 NOTE — Progress Notes (Signed)
  Echocardiogram 2D Echocardiogram has been performed.  Aaron [name] 09/16/2013, 5:07 PM

## 2013-09-16 NOTE — ED Notes (Signed)
Pt st's he was dx with Bronchitis recently and took a round of antibiotics.  St's then he has continued to have a cough and yesterday started coughing up blood.  Pt c/o pain in back from coughing.  Also has swelling in bil lower legs.

## 2013-09-16 NOTE — ED Provider Notes (Signed)
CSN: [id number]     Arrival date & time 09/15/13  2254 History   First MD Initiated Contact with Patient 09/15/13 2347     Chief Complaint  Patient presents with  . Hemoptysis  . Fever     (Consider location/radiation/quality/duration/timing/severity/associated sxs/prior Treatment) HPI Comments: 73 year old male with significant cardiac history including CAD/bypass/atrial fibrillation, hypertension, hyperlipidemia, ischemic cardiomyopathy, Merkel cell cancer in remission, chronic kidney disease presents with worsening productive cough for 3 weeks and mild shortness of breath. Patient had low-grade fevers recently and hemoptysis the past 2 days, lighter color blood. Patient is on Plavix and aspirin for his heart. Patient has had 5-6 pound weight gain recently. He is taking Lasix as directed twice daily. Leg swelling similar to previous. No chest pain. No recent hospitalization, sick contacts or travel.  Patient is a 73 y.o. male presenting with fever. The history is provided by the patient.  Fever Associated symptoms: cough   Associated symptoms: no chest pain, no chills, no congestion, no dysuria, no headaches, no rash and no vomiting     Past Medical History  Diagnosis Date  . Hyperlipidemia   . Hypertension   . CAD (coronary artery disease)     NSTEMI 11/11 -  cath: EF 40%, pLAD 30%, 90%, 30-40%, CFX stent occluded, pRCA 30%, dRCA stent ok, pPDA 40%, 30%;  s/p CABG 11/11;LHC 04/01/11: EF 30%, left RA with 30% ISR, right RA patent, RCA stent patent, severe disease in LAD and CFX, L-LAD, S-PDA, S-OM ok    . Prostate cancer     2006, s/p radical prostatectomy at [facility]  . Stroke     2007  . Injury of sigmoid colon     perforation requiring hemicolectomy and colostomy with subsequent takedown  . Renal artery stenosis     s/p prior ptca  . Chronic systolic heart failure     echo 9/12: Mild LVH, EF 25%, restrictive diast dysfxn with elevated filling pressures, mild AI, mild  enlarged Ao root 36 mm, mild MR, mod LAE, mild RVE, mod reduced RVSF, mild to mod PR, PASP 62-66 (mod pulmonary HTN), mod pleural effusion;  echo 05/2011: Mod LVH, EF 40-45%, mild AI, mild LAE, PASP 36  . Ischemic cardiomyopathy   . Carotid stenosis     bilat 40-59% in 8/11  . Diabetes mellitus   . Acute myocardial infarction, subendocardial infarction, subsequent episode of care   . Depression   . Gout   . Anxiety   . Osteoarthritis   . PVC's (premature ventricular contractions)     RBB R superior axis PVCs frequently in bigeminy   Past Surgical History  Procedure Laterality Date  . Colon surgery    . Prostate surgery    . Coronary artery bypass graft     Family History  Problem Relation Age of Onset  . Heart disease Mother   . Heart disease Father   . Heart disease Sister   . Diabetes Mother    History  Substance Use Topics  . Smoking status: Former Smoker -- 2.00 packs/day for 20 years    Types: Cigarettes    Quit date: 02/08/1977  . Smokeless tobacco: Never Used  . Alcohol Use: No    Review of Systems  Constitutional: Positive for fever and fatigue. Negative for chills.  HENT: Negative for congestion.   Eyes: Negative for visual disturbance.  Respiratory: Positive for cough and shortness of breath.   Cardiovascular: Negative for chest pain.  Gastrointestinal: Negative for vomiting and  abdominal pain.  Genitourinary: Negative for dysuria and flank pain.  Musculoskeletal: Negative for back pain, neck pain and neck stiffness.  Skin: Negative for rash.  Neurological: Negative for light-headedness and headaches.      Allergies  Sulfa drugs cross reactors and Sulfonamide derivatives  Home Medications   Current Outpatient Rx  Name  Route  Sig  Dispense  Refill  . allopurinol (ZYLOPRIM) 300 MG tablet   Oral   Take 300 mg by mouth daily.         Marland Kitchen aspirin EC 81 MG tablet   Oral   Take 81 mg by mouth daily.           . carvedilol (COREG) 12.5 MG tablet    Oral   Take 18 mg by mouth 2 (two) times daily with a meal. Take 1.5 tablets         . clopidogrel (PLAVIX) 75 MG tablet   Oral   Take 75 mg by mouth daily with breakfast.         . colchicine 0.6 MG tablet   Oral   Take 0.6 mg by mouth daily.         Marland Kitchen FLUoxetine (PROZAC) 10 MG tablet   Oral   Take 10 mg by mouth daily.         . furosemide (LASIX) 40 MG tablet   Oral   Take 80 mg by mouth daily.         Marland Kitchen guaiFENesin 200 MG tablet   Oral   Take 400 mg by mouth 2 (two) times daily.         . hydrALAZINE (APRESOLINE) 25 MG tablet   Oral   Take 25 mg by mouth 3 (three) times daily.         . isosorbide dinitrate (ISORDIL) 20 MG tablet   Oral   Take 20 mg by mouth 3 (three) times daily.         . lansoprazole (PREVACID) 30 MG capsule   Oral   Take 30 mg by mouth daily.          . potassium chloride SA (K-DUR,KLOR-CON) 20 MEQ tablet   Oral   Take 40 mEq by mouth 2 (two) times daily.         . saxagliptin HCl (ONGLYZA) 2.5 MG TABS tablet   Oral   Take 2.5 mg by mouth daily.         . valsartan (DIOVAN) 320 MG tablet   Oral   Take 320 mg by mouth daily.          . nitroGLYCERIN (NITROSTAT) 0.4 MG SL tablet   Sublingual   Place 0.4 mg under the tongue every 5 (five) minutes as needed.           BP 166/71  Pulse 71  Temp(Src) 99.2 F (37.3 C)  Resp 26  Wt 162 lb 9 oz (73.738 kg)  SpO2 95% Physical Exam  Nursing note and vitals reviewed. Constitutional: He is oriented to person, place, and time. He appears well-developed and well-nourished.  HENT:  Head: Normocephalic and atraumatic.  Dry mm  Eyes: Conjunctivae are normal. Right eye exhibits no discharge. Left eye exhibits no discharge.  Neck: Normal range of motion. Neck supple. No tracheal deviation present.  Cardiovascular: Normal rate and regular rhythm.   Pulmonary/Chest: Effort normal. He has rales (bases bilateral).  Abdominal: Soft. He exhibits no distension. There is no  tenderness. There is no guarding.  Musculoskeletal:  He exhibits no edema.  Neurological: He is alert and oriented to person, place, and time.  Skin: Skin is warm. No rash noted.  Psychiatric: He has a normal mood and affect.    ED Course  Procedures (including critical care time) Labs Review Labs Reviewed  CBC - Abnormal; Notable for the following:    WBC 13.5 (*)    Hemoglobin 10.4 (*)    HCT 33.2 (*)    MCV 76.1 (*)    MCH 23.9 (*)    RDW 18.2 (*)    Platelets 101 (*)    All other components within normal limits  BASIC METABOLIC PANEL - Abnormal; Notable for the following:    Glucose, Bld 150 (*)    BUN 39 (*)    Creatinine, Ser 1.51 (*)    GFR calc non Af Amer 45 (*)    GFR calc Af Amer 52 (*)    All other components within normal limits  PRO B NATRIURETIC PEPTIDE  I-STAT TROPOININ, ED  I-STAT CG4 LACTIC ACID, ED   Imaging Review Dg Chest 2 View  09/16/2013   CLINICAL DATA:  Hemoptysis, fever, history of bronchitis.  EXAM: CHEST  2 VIEW  COMPARISON:  DG CHEST 2V dated 04/04/2012  FINDINGS: The cardiac silhouette appears moderate to severely enlarged, slightly increased from prior examination. Central pulmonary vasculature congestion. Patchy right lower lobe airspace opacity with small bilateral pleural effusions. Mild chronic interstitial changes. Biapical pleural thickening. Trachea projects midline and there is no pneumothorax.  Status post median sternotomy for apparent CABG. Mild degenerative change of the thoracic spine. Soft tissue planes are nonsuspicious.  IMPRESSION: Moderate to severe cardiomegaly with central pulmonary vasculature congestion. Right lower lobe consolidation could reflect confluent edema or even pneumonia with small bilateral pleural effusions. Recommend followup chest radiograph after treatment to verify improvement.   Electronically Signed   By: Elon Alas   On: 09/16/2013 00:23     EKG Interpretation   Date/Time:  Sunday September 15 2013  23:01:47 EDT Ventricular Rate:  72 PR Interval:  190 QRS Duration: 102 QT Interval:  412 QTC Calculation: 451 R Axis:   -13 Text Interpretation:  Normal sinus rhythm Incomplete right bundle branch  block ST \\T \ T wave abnormality, consider lateral ischemia Abnormal ECG  Confirmed by Elisabel Hanover  MD, Sidhant Helderman ([id number]) on 09/15/2013 11:48:31 PM      MDM   Final diagnoses:  CAP (community acquired pneumonia)  Acute on chronic systolic CHF (congestive heart failure)  Hemoptysis  Diabetes mellitus  Chronic kidney disease, stage IV (severe)  HYPERTENSION, UNSPECIFIED    Clinically patient presents with pneumonia versus bronchitis with underlying ischemic cardiomyopathy/CHF. With fever, white blood cell elevation, productive cough and x-ray concerning for pneumonia Will treat clinically has pneumonia with community-acquired antibiotics Rocephin and azithromycin. He should has hallucinations with Levaquin. We'll be cautious with fluids given mild pulmonary edema on chest x-ray and heart history. Lactate normal. When for observation in the hospital given her cardiac history worsening symptoms and abnormal chest x-ray. Patient does not require oxygen at this time blood pressure is elevated, patient has a history of hypertension and does not have any chest pain at this time. EKG non specific ST changes.   The patients results and plan were reviewed and discussed.   Any x-rays performed were personally reviewed by myself.   Differential diagnosis were considered with the presenting HPI. TRIAD paged, evaluated and agreed with plan.  EKG: reviewed  Filed Vitals:   09/15/13  2305  BP: 166/71  Pulse: 71  Temp: 99.2 F (37.3 C)  Resp: 26  Weight: 162 lb 9 oz (73.738 kg)  SpO2: 95%    Admission/ observation were discussed with the admitting physician, patient and/or family and they are comfortable with the plan.    Mariea Clonts, MD 09/17/13 [phone]

## 2013-09-16 NOTE — H&P (Signed)
PCP:  Nicoletta Dress, MD  Cardiology [facility]  Chief Complaint:  Coughing up blood  HPI: Aaron [name] is a 73 y.o. male   has a past medical history of Hyperlipidemia; Hypertension; CAD (coronary artery disease); Prostate cancer; Stroke; Injury of sigmoid colon; Renal artery stenosis; Chronic systolic heart failure; Ischemic cardiomyopathy; Carotid stenosis; Diabetes mellitus; Acute myocardial infarction, subendocardial infarction, subsequent episode of care; Depression; Gout; Anxiety; Osteoarthritis; and PVC's (premature ventricular contractions).   Presented with  3 week hx of cough but worse for the past week. Yesterday he started to cough up a  Small amount of blood that now started to turn slightly darker brown color. He had some low grade fever, chills, fatigue, no myalgias, denies any chest pain. He had some shortness of breath and some increase swelling in ankles. He thinks he has gained 4-5 lb in the past few weeks. Have been taking his medications as prescribed. No dietary indiscretions. Patient presented to ER and was found to have likely CAP vs mild CHF. No new oxygen requirement.  Of note he has hx of Merkel cell cancer sp radiation therapy at [facility].   Of note patient is sp cardiac stenting states around 3-4 year ago.  ` Review of Systems:    Pertinent positives include:Fevers, chills,productive cough, coughing up of blood.  Constitutional:  No weight loss, night sweats,  fatigue, weight loss  HEENT:  No headaches, Difficulty swallowing,Tooth/dental problems,Sore throat,  No sneezing, itching, ear ache, nasal congestion, post nasal drip,  Cardio-vascular:  No chest pain, Orthopnea, PND, anasarca, dizziness, palpitations.no Bilateral lower extremity swelling  GI:  No heartburn, indigestion, abdominal pain, nausea, vomiting, diarrhea, change in bowel habits, loss of appetite, melena, blood in stool, hematemesis Resp:  no shortness of  breath at rest. No dyspnea on exertion, No excess mucus, no  No non-productive cough, No No change in color of mucus.No wheezing. Skin:  no rash or lesions. No jaundice GU:  no dysuria, change in color of urine, no urgency or frequency. No straining to urinate.  No flank pain.  Musculoskeletal:  No joint pain or no joint swelling. No decreased range of motion. No back pain.  Psych:  No change in mood or affect. No depression or anxiety. No memory loss.  Neuro: no localizing neurological complaints, no tingling, no weakness, no double vision, no gait abnormality, no slurred speech, no confusion  Otherwise ROS are negative except for above, 10 systems were reviewed  Past Medical History: Past Medical History  Diagnosis Date  . Hyperlipidemia   . Hypertension   . CAD (coronary artery disease)     NSTEMI 11/11 -  cath: EF 40%, pLAD 30%, 90%, 30-40%, CFX stent occluded, pRCA 30%, dRCA stent ok, pPDA 40%, 30%;  s/p CABG 11/11;LHC 04/01/11: EF 30%, left RA with 30% ISR, right RA patent, RCA stent patent, severe disease in LAD and CFX, L-LAD, S-PDA, S-OM ok    . Prostate cancer     2006, s/p radical prostatectomy at [facility]  . Stroke     2007  . Injury of sigmoid colon     perforation requiring hemicolectomy and colostomy with subsequent takedown  . Renal artery stenosis     s/p prior ptca  . Chronic systolic heart failure     echo 9/12: Mild LVH, EF 25%, restrictive diast dysfxn with elevated filling pressures, mild AI, mild enlarged Ao root 36 mm, mild MR, mod LAE, mild RVE, mod reduced RVSF, mild to  mod PR, PASP 62-66 (mod pulmonary HTN), mod pleural effusion;  echo 05/2011: Mod LVH, EF 40-45%, mild AI, mild LAE, PASP 36  . Ischemic cardiomyopathy   . Carotid stenosis     bilat 40-59% in 8/11  . Diabetes mellitus   . Acute myocardial infarction, subendocardial infarction, subsequent episode of care   . Depression   . Gout   . Anxiety   . Osteoarthritis   . PVC's (premature  ventricular contractions)     RBB R superior axis PVCs frequently in bigeminy   Past Surgical History  Procedure Laterality Date  . Colon surgery    . Prostate surgery    . Coronary artery bypass graft       Medications: Prior to Admission medications   Medication Sig Start Date End Date Taking? Authorizing Provider  allopurinol (ZYLOPRIM) 300 MG tablet Take 300 mg by mouth daily.   Yes Historical Provider, MD  aspirin EC 81 MG tablet Take 81 mg by mouth daily.     Yes Historical Provider, MD  carvedilol (COREG) 12.5 MG tablet Take 18 mg by mouth 2 (two) times daily with a meal. Take 1.5 tablets   Yes Historical Provider, MD  clopidogrel (PLAVIX) 75 MG tablet Take 75 mg by mouth daily with breakfast.   Yes Historical Provider, MD  colchicine 0.6 MG tablet Take 0.6 mg by mouth daily.   Yes Historical Provider, MD  FLUoxetine (PROZAC) 10 MG tablet Take 10 mg by mouth daily.   Yes Historical Provider, MD  furosemide (LASIX) 40 MG tablet Take 80 mg by mouth daily.   Yes Historical Provider, MD  guaiFENesin 200 MG tablet Take 400 mg by mouth 2 (two) times daily.   Yes Historical Provider, MD  hydrALAZINE (APRESOLINE) 25 MG tablet Take 25 mg by mouth 3 (three) times daily.   Yes Historical Provider, MD  isosorbide dinitrate (ISORDIL) 20 MG tablet Take 20 mg by mouth 3 (three) times daily.   Yes Historical Provider, MD  lansoprazole (PREVACID) 30 MG capsule Take 30 mg by mouth daily.    Yes Historical Provider, MD  potassium chloride SA (K-DUR,KLOR-CON) 20 MEQ tablet Take 40 mEq by mouth 2 (two) times daily.   Yes Historical Provider, MD  saxagliptin HCl (ONGLYZA) 2.5 MG TABS tablet Take 2.5 mg by mouth daily.   Yes Historical Provider, MD  valsartan (DIOVAN) 320 MG tablet Take 320 mg by mouth daily.    Yes Historical Provider, MD  nitroGLYCERIN (NITROSTAT) 0.4 MG SL tablet Place 0.4 mg under the tongue every 5 (five) minutes as needed.     Historical Provider, MD    Allergies:   Allergies   Allergen Reactions  . Sulfa Drugs Cross Reactors Rash    Legs only.  . Sulfonamide Derivatives Rash    Legs only.    Social History:  Ambulatory  Independently  Lives at   Home with family   reports that he quit smoking about 36 years ago. His smoking use included Cigarettes. He has a 40 pack-year smoking history. He has never used smokeless tobacco. He reports that he does not drink alcohol or use illicit drugs.   Family History: family history includes Diabetes in his mother; Heart disease in his father, mother, and sister.    Physical Exam: Patient Vitals for the past 24 hrs:  BP Temp Pulse Resp SpO2 Weight  09/15/13 2305 166/71 mmHg 99.2 F (37.3 C) 71 26 95 % 73.738 kg (162 lb 9 oz)  1. General:  in No Acute distress 2. Psychological: Alert and  Oriented 3. Head/ENT:   Moist   Mucous Membranes                          Head Non traumatic, neck supple                          Normal   Dentition 4. SKIN: normal   Skin turgor,  Skin clean Dry and intact no rash 5. Heart: Regular rate and rhythm systolic Murmur, Rub or gallop 6. Lungs:  no wheezes   crackles  On the left base 7. Abdomen: Soft, non-tender, Non distended 8. Lower extremities: no clubbing, cyanosis, 2+edema 9. Neurologically Grossly intact, moving all 4 extremities equally 10. MSK: Normal range of motion  body mass index is 26.25 kg/(m^2).   Labs on Admission:   Recent Labs  09/15/13 2308  NA 143  K 4.5  CL 103  CO2 22  GLUCOSE 150*  BUN 39*  CREATININE 1.51*  CALCIUM 9.3   No results found for this basename: AST, ALT, ALKPHOS, BILITOT, PROT, ALBUMIN,  in the last 72 hours No results found for this basename: LIPASE, AMYLASE,  in the last 72 hours  Recent Labs  09/15/13 2308  WBC 13.5*  HGB 10.4*  HCT 33.2*  MCV 76.1*  PLT 101*   No results found for this basename: CKTOTAL, CKMB, CKMBINDEX, TROPONINI,  in the last 72 hours No results found for this basename: TSH, T4TOTAL, FREET3,  T3FREE, THYROIDAB,  in the last 72 hours No results found for this basename: VITAMINB12, FOLATE, FERRITIN, TIBC, IRON, RETICCTPCT,  in the last 72 hours No results found for this basename: HGBA1C    The CrCl is unknown because both a height and weight (above a minimum accepted value) are required for this calculation. ABG    Component Value Date/Time   PHART 7.429 05/17/2010 1154   HCO3 25.2* 04/01/2011 1439   TCO2 26 04/01/2011 1439   ACIDBASEDEF 3.0* 05/12/2010 1756   O2SAT 65.0 04/01/2011 1439     No results found for this basename: DDIMER     Other results:  I have pearsonaly reviewed this: ECG REPORT  Rate: 72   Rhythm: NSR incomplete RBBB ST&T Change: T wave inversion in lead  aVL    Cultures:    Component Value Date/Time   SDES URINE, CLEAN CATCH 05/25/2010 0256   SPECREQUEST NONE 05/25/2010 0256   CULT NO GROWTH 05/25/2010 0256   REPTSTATUS 05/26/2010 FINAL 05/25/2010 0256       Radiological Exams on Admission: Dg Chest 2 View  09/16/2013   CLINICAL DATA:  Hemoptysis, fever, history of bronchitis.  EXAM: CHEST  2 VIEW  COMPARISON:  DG CHEST 2V dated 04/04/2012  FINDINGS: The cardiac silhouette appears moderate to severely enlarged, slightly increased from prior examination. Central pulmonary vasculature congestion. Patchy right lower lobe airspace opacity with small bilateral pleural effusions. Mild chronic interstitial changes. Biapical pleural thickening. Trachea projects midline and there is no pneumothorax.  Status post median sternotomy for apparent CABG. Mild degenerative change of the thoracic spine. Soft tissue planes are nonsuspicious.  IMPRESSION: Moderate to severe cardiomegaly with central pulmonary vasculature congestion. Right lower lobe consolidation could reflect confluent edema or even pneumonia with small bilateral pleural effusions. Recommend followup chest radiograph after treatment to verify improvement.   Electronically Signed   By: Elon Alas  On: 09/16/2013 00:23    Chart has been reviewed  Assessment/Plan  73 year old gentleman with history of ischemic cardiomyopathy here with hemoptysis and evidence of CHF as well as needed or in the morning  Present on Admission:  . Hemoptysis - in the setting of pneumonia. Seems to be improving. We'll hold Plavix for now. Would need repeat chest imaging to document clearance  . CAP (community acquired pneumonia) -  - will admit for treatment of CAP will start on appropriate antibiotic coverage.   Obtain sputum cultures, blood cultures if febrile or if decompensates.  Provide oxygen as needed.  . CAD, NATIVE VESSEL - cycle cardiac enzymes currently chest free . Chronic kidney disease, stage IV (severe) - with renal toxic medications. Watch potassium at this point renal functions  . HYPERTENSION, UNSPECIFIED - continue home medications  . Acute on chronic systolic CHF (congestive heart failure) - will diurese with IV Lasix. Last echogram was done in October 2014 showed EF 20-25%. We'll repeat  . Diabetes mellitus - sliding scale hold onglysa . Atrial fibrillation her records and history of atrial fibrillation. Continue to monitor on telemetry, currently appears to be in sinus  Prophylaxis: SCD, Protonix  CODE STATUS: DNR/DNI as per patient's wishes  Other plan as per orders.  I have spent a total of 55 min on this admission  Esiquio Boesen 09/16/2013, 2:09 AM

## 2013-09-16 NOTE — Consult Note (Signed)
PHARMACY NOTE  CONSULT :  Renal Adjustment of Antibiotics,  Ceftriaxone, Azithromycin INDICATION :  CAP  ASSESSMENT:  Pharmacy consulted for Renal adjustment of Antibiotics.     Day # 1 Ceftriaxone 1gm IV q 24 hours and Azithromycin 500 mg IV q 24 hours for CAP.  Neither require renal adjustments.  Weight  74 kg ,  CrCl  41 ml/min  Ordered doses are appropriate  PLAN:  1. Continue Ceftriaxone and Azithromycin at the current doses and schedules. 2. Recommend Monitoring renal function, Scr, UOP, WBC's, fever curve, any cultures/sensitivities, and clinical progression. 3. Pharmacy will sign off and follow peripherally given no adjustments in doses or schedules are expected. 4. Pharmacy has alerts in place to inform of dramatic changes in renal or hepatic functions that might require dosage or schedule adjustments. 5. Please re-consult if additional assistance is needed.  Thank you for allowing Pharmacy to participate in this patient's care   Estelle June,  Pharm.D. ,  09/16/2013,  3:15 PM

## 2013-09-16 NOTE — Progress Notes (Signed)
PROGRESS NOTE  Aaron [name] JEH:[id number] DOB: [birth year] DOA: 09/15/2013 PCP: Nicoletta Dress, MD  Assessment/Plan: CAP - strep pneumo positive, continue Abx Hemoptysis - resolved overnight, clear sputum this morning CAD - stable, no chest pain CKD - monitor renal function HTN - continue home medications Acute on chronic systolic heart failure - diuresis, daily weights, strict I&Os. Repeat 2D echo DM - SSI History of A fib - telemetry   Diet: heart Fluids: none DVT Prophylaxis: SCD  Code Status: DNR Family Communication: d/w patient  Disposition Plan: inpatient  Consultants:  none  Procedures:  2D echo   Antibiotics Ceftriaxone 3/30 >> Azithromycin 3/30 >>  HPI/Subjective: - denies chest pain, not short of breath but continues to cough  Objective: Filed Vitals:   09/16/13 0315 09/16/13 0341 09/16/13 0505 09/16/13 1102  BP: 169/80  168/97 143/70  Pulse: 75  74 65  Temp: 99 F (37.2 C)  99 F (37.2 C)   TempSrc: Oral  Oral   Resp: 20  20   Height: 5\' 6"  (1.676 m)     Weight:  73.738 kg (162 lb 9 oz)    SpO2: 95%  96%     Intake/Output Summary (Last 24 hours) at 09/16/13 1205 Last data filed at 09/16/13 1055  Gross per 24 hour  Intake  24078 ml  Output    875 ml  Net  23203 ml   Filed Weights   09/15/13 2305 09/16/13 0341  Weight: 73.738 kg (162 lb 9 oz) 73.738 kg (162 lb 9 oz)    Exam:   General:  NAD  Cardiovascular: regular rate and rhythm, without MRG  Respiratory: good air movement, no wheezing, mild crackles  Abdomen: soft, not tender to palpation, positive bowel sounds  MSK: no peripheral edema  Neuro: non focal  Data Reviewed: Basic Metabolic Panel:  Recent Labs Lab 09/15/13 2308 09/16/13 0440  NA 143 141  K 4.5 4.4  CL 103 102  CO2 22 20  GLUCOSE 150* 137*  BUN 39* 40*  CREATININE 1.51* 1.49*  CALCIUM 9.3 9.3   Liver Function Tests:  Recent Labs Lab 09/16/13 0440  AST 15  ALT 17  ALKPHOS 98    BILITOT 1.4*  PROT 6.2  ALBUMIN 3.3*   No results found for this basename: LIPASE, AMYLASE,  in the last 168 hours No results found for this basename: AMMONIA,  in the last 168 hours CBC:  Recent Labs Lab 09/15/13 2308 09/16/13 0440  WBC 13.5* 14.0*  NEUTROABS  --  13.0*  HGB 10.4* 10.0*  HCT 33.2* 32.1*  MCV 76.1* 76.1*  PLT 101* 102*   Cardiac Enzymes:  Recent Labs Lab 09/16/13 0440  TROPONINI <0.30   BNP (last 3 results)  Recent Labs  09/16/13 0440  PROBNP 23918.0*   CBG:  Recent Labs Lab 09/16/13 0629 09/16/13 1117  GLUCAP 144* 131*    No results found for this or any previous visit (from the past 240 hour(s)).   Studies: Dg Chest 2 View  09/16/2013   CLINICAL DATA:  Hemoptysis, fever, history of bronchitis.  EXAM: CHEST  2 VIEW  COMPARISON:  DG CHEST 2V dated 04/04/2012  FINDINGS: The cardiac silhouette appears moderate to severely enlarged, slightly increased from prior examination. Central pulmonary vasculature congestion. Patchy right lower lobe airspace opacity with small bilateral pleural effusions. Mild chronic interstitial changes. Biapical pleural thickening. Trachea projects midline and there is no pneumothorax.  Status post median sternotomy for apparent CABG. Mild degenerative change  of the thoracic spine. Soft tissue planes are nonsuspicious.  IMPRESSION: Moderate to severe cardiomegaly with central pulmonary vasculature congestion. Right lower lobe consolidation could reflect confluent edema or even pneumonia with small bilateral pleural effusions. Recommend followup chest radiograph after treatment to verify improvement.   Electronically Signed   By: Elon Alas   On: 09/16/2013 00:23    Scheduled Meds: . allopurinol  300 mg Oral Daily  . aspirin EC  81 mg Oral Daily  . [START ON 09/17/2013] azithromycin  500 mg Intravenous Q24H  . carvedilol  18.75 mg Oral BID WC  . cefTRIAXone (ROCEPHIN)  IV  1 g Intravenous Q24H  . colchicine  0.6 mg  Oral Daily  . FLUoxetine  10 mg Oral Daily  . furosemide  40 mg Intravenous BID  . guaiFENesin  400 mg Oral BID  . hydrALAZINE  25 mg Oral TID  . insulin aspart  0-15 Units Subcutaneous TID WC  . insulin aspart  0-5 Units Subcutaneous QHS  . irbesartan  300 mg Oral Daily  . isosorbide dinitrate  20 mg Oral TID  . pantoprazole  40 mg Oral Daily  . potassium chloride SA  40 mEq Oral BID  . sodium chloride  3 mL Intravenous Q12H   Continuous Infusions:   Active Problems:   HYPERTENSION, UNSPECIFIED   CAD, NATIVE VESSEL   Atrial fibrillation   Ischemic cardiomyopathy   Chronic kidney disease, stage IV (severe)   Hemoptysis   CAP (community acquired pneumonia)   Acute on chronic systolic CHF (congestive heart failure)   Diabetes mellitus  Time spent: 25  This note has been created with Surveyor, quantity. Any transcriptional errors are unintentional.   Marzetta Board, MD [facility] Pager [phone]. If 7 PM - 7 AM, please contact night-coverage at [email or web address], password [facility] 09/16/2013, 12:05 PM  LOS: 1 day

## 2013-09-16 NOTE — Progress Notes (Signed)
Utilization Review Completed.Aaron [name] T3/30/2015  

## 2013-09-17 DIAGNOSIS — R0609 Other forms of dyspnea: Secondary | ICD-10-CM

## 2013-09-17 DIAGNOSIS — R0989 Other specified symptoms and signs involving the circulatory and respiratory systems: Secondary | ICD-10-CM

## 2013-09-17 LAB — BASIC METABOLIC PANEL
BUN: 47 mg/dL — ABNORMAL HIGH (ref 6–23)
CO2: 23 mEq/L (ref 19–32)
Calcium: 8.7 mg/dL (ref 8.4–10.5)
Chloride: 105 mEq/L (ref 96–112)
Creatinine, Ser: 1.55 mg/dL — ABNORMAL HIGH (ref 0.50–1.35)
GFR, EST AFRICAN AMERICAN: 50 mL/min — AB (ref >90)
GFR, EST NON AFRICAN AMERICAN: 43 mL/min — AB (ref >90)
Glucose, Bld: 119 mg/dL — ABNORMAL HIGH (ref 70–99)
Potassium: 4.4 mEq/L (ref 3.7–5.3)
SODIUM: 142 meq/L (ref 137–147)

## 2013-09-17 LAB — CBC
HCT: 29.5 % — ABNORMAL LOW (ref 39.0–52.0)
HEMOGLOBIN: 9.1 g/dL — AB (ref 13.0–17.0)
MCH: 23.5 pg — ABNORMAL LOW (ref 26.0–34.0)
MCHC: 30.8 g/dL (ref 30.0–36.0)
MCV: 76 fL — ABNORMAL LOW (ref 78.0–100.0)
PLATELETS: 84 10*3/uL — AB (ref 150–400)
RBC: 3.88 MIL/uL — AB (ref 4.22–5.81)
RDW: 18.3 % — ABNORMAL HIGH (ref 11.5–15.5)
WBC: 10.1 10*3/uL (ref 4.0–10.5)

## 2013-09-17 LAB — GLUCOSE, CAPILLARY
GLUCOSE-CAPILLARY: 141 mg/dL — AB (ref 70–99)
Glucose-Capillary: 149 mg/dL — ABNORMAL HIGH (ref 70–99)
Glucose-Capillary: 112 mg/dL — ABNORMAL HIGH (ref 70–99)
Glucose-Capillary: 148 mg/dL — ABNORMAL HIGH (ref 70–99)

## 2013-09-17 MED ORDER — AZITHROMYCIN 500 MG PO TABS
500.0000 mg | ORAL_TABLET | ORAL | Status: DC
  Administered 2013-09-17 – 2013-09-18 (×2): 500 mg via ORAL
  Filled 2013-09-17 (×4): qty 1

## 2013-09-17 NOTE — Consult Note (Signed)
CONSULT NOTE  Date: 09/17/2013               Patient Name:  Aaron [name] MRN: [id number]  DOB: 1941-01-19 Age / Sex: 73 y.o., male        PCP: Nelda Bucks E Primary Cardiologist: Stanford Breed            Referring Physician: Wardell Heath              Reason for Consult: CHF           History of Present Illness: Patient is a 73 y.o. male with a PMHx of coronary artery disease, atrial fibrillation, congestive heart failure, who was admitted to [facility] on 09/15/2013 for evaluation of a three-week history of progressive shortness of breath and cough. He also has had some hemoptysis. He was thought to have either community acquired pneumonia versus mild congestive heart failure.  His white blood cell count was elevated on admission.  I have personally reviewed His chest x-ray and it shows a new right lower lobe infiltrate compared to his previous chest x-ray from October.  His Procalcitonin level was elevated.  Strep pneumo urinary antigen was positive.     he has been on antibiotics. He has been diuresing.    He's had a little bit of increasing shortness of breath with exertion of the past several months. He typically sees Dr. Kirk Ruths as an outpatient.   Medications: Outpatient medications: Prescriptions prior to admission  Medication Sig Dispense Refill  . allopurinol (ZYLOPRIM) 300 MG tablet Take 300 mg by mouth daily.      Marland Kitchen aspirin EC 81 MG tablet Take 81 mg by mouth daily.        . carvedilol (COREG) 12.5 MG tablet Take 18 mg by mouth 2 (two) times daily with a meal. Take 1.5 tablets      . clopidogrel (PLAVIX) 75 MG tablet Take 75 mg by mouth daily with breakfast.      . colchicine 0.6 MG tablet Take 0.6 mg by mouth daily.      Marland Kitchen FLUoxetine (PROZAC) 10 MG tablet Take 10 mg by mouth daily.      . furosemide (LASIX) 40 MG tablet Take 80 mg by mouth daily.      Marland Kitchen guaiFENesin 200 MG tablet Take 400 mg by mouth 2 (two) times daily.      . hydrALAZINE (APRESOLINE) 25 MG  tablet Take 25 mg by mouth 3 (three) times daily.      . isosorbide dinitrate (ISORDIL) 20 MG tablet Take 20 mg by mouth 3 (three) times daily.      . lansoprazole (PREVACID) 30 MG capsule Take 30 mg by mouth daily.       . potassium chloride SA (K-DUR,KLOR-CON) 20 MEQ tablet Take 40 mEq by mouth 2 (two) times daily.      . saxagliptin HCl (ONGLYZA) 2.5 MG TABS tablet Take 2.5 mg by mouth daily.      . valsartan (DIOVAN) 320 MG tablet Take 320 mg by mouth daily.       . nitroGLYCERIN (NITROSTAT) 0.4 MG SL tablet Place 0.4 mg under the tongue every 5 (five) minutes as needed.         Current medications: Current Facility-Administered Medications  Medication Dose Route Frequency Provider Last Rate Last Dose  . 0.9 %  sodium chloride infusion  250 mL Intravenous PRN Toy Baker, MD      . allopurinol (ZYLOPRIM) tablet 300 mg  300 mg Oral Daily Toy Baker, MD   300 mg at 09/17/13 1048  . aspirin EC tablet 81 mg  81 mg Oral Daily Toy Baker, MD   81 mg at 09/17/13 1048  . azithromycin (ZITHROMAX) 500 mg in dextrose 5 % 250 mL IVPB  500 mg Intravenous Q24H Toy Baker, MD   500 mg at 09/16/13 2326  . carvedilol (COREG) tablet 18.75 mg  18.75 mg Oral BID WC Toy Baker, MD   18.75 mg at 09/17/13 1048  . cefTRIAXone (ROCEPHIN) 1 g in dextrose 5 % 50 mL IVPB  1 g Intravenous Q24H Toy Baker, MD   1 g at 09/16/13 2150  . colchicine tablet 0.6 mg  0.6 mg Oral Daily Toy Baker, MD   0.6 mg at 09/17/13 1048  . FLUoxetine (PROZAC) tablet 10 mg  10 mg Oral Daily Toy Baker, MD   10 mg at 09/17/13 1051  . furosemide (LASIX) injection 40 mg  40 mg Intravenous BID Toy Baker, MD   40 mg at 09/17/13 0813  . guaiFENesin tablet 400 mg  400 mg Oral BID Toy Baker, MD   400 mg at 09/17/13 1049  . hydrALAZINE (APRESOLINE) tablet 25 mg  25 mg Oral TID Toy Baker, MD   25 mg at 09/17/13 1048  . insulin aspart (novoLOG) injection 0-15  Units  0-15 Units Subcutaneous TID WC Toy Baker, MD   2 Units at 09/17/13 [phone]  . insulin aspart (novoLOG) injection 0-5 Units  0-5 Units Subcutaneous QHS Toy Baker, MD      . irbesartan (AVAPRO) tablet 300 mg  300 mg Oral Daily Toy Baker, MD   300 mg at 09/17/13 1049  . isosorbide dinitrate (ISORDIL) tablet 20 mg  20 mg Oral TID Toy Baker, MD   20 mg at 09/17/13 1049  . ondansetron (ZOFRAN) injection 4 mg  4 mg Intravenous Q8H PRN Melton Alar, PA-C      . pantoprazole (PROTONIX) EC tablet 40 mg  40 mg Oral Daily Toy Baker, MD   40 mg at 09/17/13 1047  . potassium chloride SA (K-DUR,KLOR-CON) CR tablet 40 mEq  40 mEq Oral BID Toy Baker, MD   40 mEq at 09/17/13 1049  . sodium chloride 0.9 % injection 3 mL  3 mL Intravenous Q12H Toy Baker, MD   3 mL at 09/17/13 1051  . sodium chloride 0.9 % injection 3 mL  3 mL Intravenous PRN Toy Baker, MD         Allergies  Allergen Reactions  . Sulfa Drugs Cross Reactors Rash    Legs only.  . Sulfonamide Derivatives Rash    Legs only.     Past Medical History  Diagnosis Date  . Hyperlipidemia   . Hypertension   . CAD (coronary artery disease)     NSTEMI 11/11 -  cath: EF 40%, pLAD 30%, 90%, 30-40%, CFX stent occluded, pRCA 30%, dRCA stent ok, pPDA 40%, 30%;  s/p CABG 11/11;LHC 04/01/11: EF 30%, left RA with 30% ISR, right RA patent, RCA stent patent, severe disease in LAD and CFX, L-LAD, S-PDA, S-OM ok    . Prostate cancer     2006, s/p radical prostatectomy at [facility]  . Stroke     2007  . Injury of sigmoid colon     perforation requiring hemicolectomy and colostomy with subsequent takedown  . Renal artery stenosis     s/p prior ptca  . Chronic systolic heart failure  echo 9/12: Mild LVH, EF 25%, restrictive diast dysfxn with elevated filling pressures, mild AI, mild enlarged Ao root 36 mm, mild MR, mod LAE, mild RVE, mod reduced RVSF, mild to mod PR, PASP 62-66  (mod pulmonary HTN), mod pleural effusion;  echo 05/2011: Mod LVH, EF 40-45%, mild AI, mild LAE, PASP 36  . Ischemic cardiomyopathy   . Carotid stenosis     bilat 40-59% in 8/11  . Diabetes mellitus   . Acute myocardial infarction, subendocardial infarction, subsequent episode of care   . Depression   . Gout   . Anxiety   . Osteoarthritis   . PVC's (premature ventricular contractions)     RBB R superior axis PVCs frequently in bigeminy    Past Surgical History  Procedure Laterality Date  . Colon surgery    . Prostate surgery    . Coronary artery bypass graft      Family History  Problem Relation Age of Onset  . Heart disease Mother   . Heart disease Father   . Heart disease Sister   . Diabetes Mother     Social History:  reports that he quit smoking about 36 years ago. His smoking use included Cigarettes. He has a 40 pack-year smoking history. He has never used smokeless tobacco. He reports that he does not drink alcohol or use illicit drugs.   Review of Systems: Constitutional:  admits to fever, chills, diaphoresis, appetite change and fatigue.  HEENT: denies photophobia, eye pain, redness, hearing loss, ear pain, congestion, sore throat, rhinorrhea, sneezing, neck pain, neck stiffness and tinnitus.  Respiratory: admits to SOB, DOE, cough, chest tightness, and wheezing.  Cardiovascular: denies chest pain, palpitations and leg swelling.  Gastrointestinal: denies nausea, vomiting, abdominal pain, diarrhea, constipation, blood in stool.  Genitourinary: denies dysuria, urgency, frequency, hematuria, flank pain and difficulty urinating.  Musculoskeletal: denies  myalgias, back pain, joint swelling, arthralgias and gait problem.   Skin: denies pallor, rash and wound.  Neurological: denies dizziness, seizures, syncope, weakness, light-headedness, numbness and headaches.   Hematological: denies adenopathy, easy bruising, personal or family bleeding history.   Psychiatric/ Behavioral: denies suicidal ideation, mood changes, confusion, nervousness, sleep disturbance and agitation.    Physical Exam: BP 136/60  Pulse 65  Temp(Src) 97.8 F (36.6 C) (Oral)  Resp 18  Ht 5\' 6"  (1.676 m)  Wt 159 lb 2.8 oz (72.2 kg)  BMI 25.70 kg/m2  SpO2 100%  Wt Readings from Last 3 Encounters:  09/17/13 159 lb 2.8 oz (72.2 kg)  05/10/13 153 lb (69.4 kg)  03/13/13 157 lb 9.6 oz (71.487 kg)    General: Vital signs reviewed and noted. Well-developed, well-nourished, in no acute distress; alert,   Head: Normocephalic, atraumatic, sclera anicteric,   Neck: Supple. Negative for carotid bruits. No JVD   Lungs:   wheezing and coarse rhonchi especially in the right base. He had severe coughing with deep breaths  Heart: Irreg. Irreg. 2/6 diastolic murmur.   Abdomen:  Soft, non-tender, non-distended with normoactive bowel sounds. No hepatomegaly. No rebound/guarding. No obvious abdominal masses   MSK: Strength and the appear normal for age.   Extremities: No clubbing or cyanosis. No edema.  Distal pedal pulses are 2+ and equal   Neurologic: Alert and oriented X 3. Moves all extremities spontaneously.  Psych: Responds to questions appropriately with a normal affect.     Lab results: Basic Metabolic Panel:  Recent Labs Lab 09/15/13 2308 09/16/13 0440 09/17/13 0515  NA 143 141 142  K 4.5  4.4 4.4  CL 103 102 105  CO2 22 20 23   GLUCOSE 150* 137* 119*  BUN 39* 40* 47*  CREATININE 1.51* 1.49* 1.55*  CALCIUM 9.3 9.3 8.7    Liver Function Tests:  Recent Labs Lab 09/16/13 0440  AST 15  ALT 17  ALKPHOS 98  BILITOT 1.4*  PROT 6.2  ALBUMIN 3.3*   No results found for this basename: LIPASE, AMYLASE,  in the last 168 hours No results found for this basename: AMMONIA,  in the last 168 hours  CBC:  Recent Labs Lab 09/15/13 2308 09/16/13 0440 09/17/13 0515  WBC 13.5* 14.0* 10.1  NEUTROABS  --  13.0*  --   HGB 10.4* 10.0* 9.1*  HCT 33.2* 32.1*  29.5*  MCV 76.1* 76.1* 76.0*  PLT 101* 102* 84*    Cardiac Enzymes:  Recent Labs Lab 09/16/13 0440 09/16/13 1132 09/16/13 1707  TROPONINI <0.30 <0.30 <0.30    BNP: No components found with this basename: POCBNP,   CBG:  Recent Labs Lab 09/16/13 0629 09/16/13 1117 09/16/13 1620 09/16/13 2116 09/17/13 0638  GLUCAP 144* 131* 137* 168* 149*    Coagulation Studies:  Recent Labs  09/16/13 0440  LABPROT 20.8*  INR 1.85*     Other results: Tele:  NSR with PACs and PVCs  Imaging: Dg Chest 2 View  09/16/2013   CLINICAL DATA:  Hemoptysis, fever, history of bronchitis.  EXAM: CHEST  2 VIEW  COMPARISON:  DG CHEST 2V dated 04/04/2012  FINDINGS: The cardiac silhouette appears moderate to severely enlarged, slightly increased from prior examination. Central pulmonary vasculature congestion. Patchy right lower lobe airspace opacity with small bilateral pleural effusions. Mild chronic interstitial changes. Biapical pleural thickening. Trachea projects midline and there is no pneumothorax.  Status post median sternotomy for apparent CABG. Mild degenerative change of the thoracic spine. Soft tissue planes are nonsuspicious.  IMPRESSION: Moderate to severe cardiomegaly with central pulmonary vasculature congestion. Right lower lobe consolidation could reflect confluent edema or even pneumonia with small bilateral pleural effusions. Recommend followup chest radiograph after treatment to verify improvement.   Electronically Signed   By: Elon Alas   On: 09/16/2013 00:23       Assessment & Plan:  1.  dyspnea: The patient has long history of congestive heart failure but appears to be very stable. He is admitted with a 2 to three-week history of hemoptysis, severe cough, low-grade fevers.  On admission he had an elevated white count. He had an elevated pro-Calcitonin  level, he had positive strep antigen in his urine.  His chest x-ray is entirely consistent with pneumonia. I don't  think there is much evidence to suggest that this is due to poorly controlled congestive heart failure.  I would continue with aggressive therapy for his strep pneumonia. He will need to see Dr. Stanford Breed as an outpatient. He does report having some shortness of breath that he dates this pneumonia but I do not think it will be a little sort that out at this time. I would suggest that he return to see Dr. Stanford Breed for office visit after this pneumonia has  completely resolved.  We'll sign off. Please call for questions.   Thayer Headings, Brooke Bonito., MD, [facility] 09/17/2013, 12:02 PM Office - [phone] Pager [phone]

## 2013-09-17 NOTE — Evaluation (Signed)
Occupational Therapy Evaluation and Discharge Patient Details Name: Aaron [name] MRN: [id number] DOB: 1941-06-09 Today's Date: 09/17/2013    History of Present Illness pt is a 73 y/o male admitted for CAP vs mild CHF   Clinical Impression   This 73 yo male admitted with above presents to acute OT with generalized weakness functioning at a Mod I level (increased time to complete tasks). No further OT needs identified, we will sign off.     Follow Up Recommendations  No OT follow up    Equipment Recommendations  None recommended by OT       Precautions / Restrictions Precautions Precautions: None Restrictions Weight Bearing Restrictions: No      Mobility Bed Mobility Overal bed mobility: Modified Independent             General bed mobility comments: increased time  Transfers Overall transfer level: Modified independent   Transfers: Sit to/from Stand Sit to Stand: Independent                   ADL                     General ADL Comments: increased time due to generalized decreased endurance from being sick. Educated pt on purse lipped breathing when he feels short of breath (ususally after a coughing fit) and that he could possibly benefit from a shower seat for endurance purposes or perhaps just sponge bath until he feels well enough again to take a shower without wearing himself out.               Pertinent Vitals/Pain sats 96% on RA and HR 66        Extremity/Trunk Assessment Upper Extremity Assessment Upper Extremity Assessment: Overall WFL for tasks assessed     Communication Communication Communication: No difficulties   Cognition Arousal/Alertness: Awake/alert Behavior During Therapy: WFL for tasks assessed/performed Overall Cognitive Status: Within Functional Limits for tasks assessed                             Home Living Family/patient expects to be discharged to:: Private residence Living Arrangements:  Spouse/significant other Available Help at Discharge: Family Type of Home: House Home Access: Stairs to enter Technical brewer of Steps: 4-5 Entrance Stairs-Rails: Right Home Layout: One level               Home Equipment: Cane - single point          Prior Functioning/Environment Level of Independence: Independent                                   End of Session: Equipment Utilized During Treatment:  (None)  Activity Tolerance: Patient tolerated treatment well Patient left: in chair;with call bell/phone within reach   Time: 0931-0951 OT Time Calculation (min): 20 min Charges:  OT General Charges $OT Visit: 1 Procedure OT Evaluation $Initial OT Evaluation Tier I: 1 Procedure OT Treatments $Self Care/Home Management : 8-22 mins    Almon Register  [phone]  09/17/2013, 10:05 AM

## 2013-09-17 NOTE — Progress Notes (Addendum)
PROGRESS NOTE  Aaron [name]:[id number] DOB: 07-07-1940 DOA: 09/15/2013 PCP: Nicoletta Dress, MD  HPI Presented with 3 week hx of cough but worse for the past week. Yesterday he started to cough up a Small amount of blood that now started to turn slightly darker brown color. He had some low grade fever, chills, fatigue, no myalgias, denies any chest pain. He had some shortness of breath and some increase swelling in ankles. He thinks he has gained 4-5 lb in the past few weeks. Have been taking his medications as prescribed. No dietary indiscretions. Patient presented to ER and was found to have likely CAP vs mild CHF. No new oxygen requirement.   Assessment/Plan:  CAP - strep pneumo positive, continue Abx, narrow to oral on discharge.  Progressive DOE for several months - cardiology consulted today, appreciate input. No further workup as inpatient, he has follow up with Dr. Stanford Breed in 5 weeks as an outpatient.  Acute on chronic systolic heart failure - diuresis, daily weights, strict I&Os. Repeat 2D echo with EF 25-30% and grade 2 diastolic dysfunction. Continue diuresis, weight 162 >> 159, consider po Lasix in am.  Hemoptysis - mild dark tinged sputum overnight CAD - stable, no chest pain CKD - monitor renal function, stable with diuresis.  HTN - continue home medications DM - SSI Thrombocytopenia - no bleeding, monitor. History of A fib - telemetry, he is in sinus  Diet: heart Fluids: none DVT Prophylaxis: SCD  Code Status: DNR Family Communication: d/w patient  Disposition Plan: inpatient, home 1-2 days   Consultants:  none  Procedures:  2D echo   Antibiotics Ceftriaxone 3/30 >> Azithromycin 3/30 >>  HPI/Subjective: - denies chest pain, not short of breath, cough improved.   Objective: Filed Vitals:   09/16/13 1324 09/16/13 1809 09/16/13 2031 09/17/13 0641  BP: 155/51 126/66 126/65 136/60  Pulse: 66 83 69 65  Temp: 99 F (37.2 C)  98.7 F (37.1 C)  97.8 F (36.6 C)  TempSrc: Oral  Oral Oral  Resp: 19  18 18   Height:      Weight:    72.2 kg (159 lb 2.8 oz)  SpO2: 99%  96% 100%    Intake/Output Summary (Last 24 hours) at 09/17/13 1043 Last data filed at 09/17/13 0647  Gross per 24 hour  Intake    903 ml  Output   1875 ml  Net   -972 ml   Filed Weights   09/15/13 2305 09/16/13 0341 09/17/13 0641  Weight: 73.738 kg (162 lb 9 oz) 73.738 kg (162 lb 9 oz) 72.2 kg (159 lb 2.8 oz)    Exam:   General:  NAD  Cardiovascular: regular rate and rhythm, without MRG  Respiratory: good air movement, no wheezing, mild crackles  Abdomen: soft, not tender to palpation, positive bowel sounds  MSK: no peripheral edema  Neuro: non focal  Data Reviewed: Basic Metabolic Panel:  Recent Labs Lab 09/15/13 2308 09/16/13 0440 09/17/13 0515  NA 143 141 142  K 4.5 4.4 4.4  CL 103 102 105  CO2 22 20 23   GLUCOSE 150* 137* 119*  BUN 39* 40* 47*  CREATININE 1.51* 1.49* 1.55*  CALCIUM 9.3 9.3 8.7   Liver Function Tests:  Recent Labs Lab 09/16/13 0440  AST 15  ALT 17  ALKPHOS 98  BILITOT 1.4*  PROT 6.2  ALBUMIN 3.3*   CBC:  Recent Labs Lab 09/15/13 2308 09/16/13 0440 09/17/13 0515  WBC 13.5* 14.0* 10.1  NEUTROABS  --  13.0*  --   HGB 10.4* 10.0* 9.1*  HCT 33.2* 32.1* 29.5*  MCV 76.1* 76.1* 76.0*  PLT 101* 102* 84*   Cardiac Enzymes:  Recent Labs Lab 09/16/13 0440 09/16/13 1132 09/16/13 1707  TROPONINI <0.30 <0.30 <0.30   BNP (last 3 results)  Recent Labs  09/16/13 0440  PROBNP 23918.0*   CBG:  Recent Labs Lab 09/16/13 0629 09/16/13 1117 09/16/13 1620 09/16/13 2116 09/17/13 0638  GLUCAP 144* 131* 137* 168* 149*   Studies: Dg Chest 2 View  09/16/2013   CLINICAL DATA:  Hemoptysis, fever, history of bronchitis.  EXAM: CHEST  2 VIEW  COMPARISON:  DG CHEST 2V dated 04/04/2012  FINDINGS: The cardiac silhouette appears moderate to severely enlarged, slightly increased from prior examination. Central  pulmonary vasculature congestion. Patchy right lower lobe airspace opacity with small bilateral pleural effusions. Mild chronic interstitial changes. Biapical pleural thickening. Trachea projects midline and there is no pneumothorax.  Status post median sternotomy for apparent CABG. Mild degenerative change of the thoracic spine. Soft tissue planes are nonsuspicious.  IMPRESSION: Moderate to severe cardiomegaly with central pulmonary vasculature congestion. Right lower lobe consolidation could reflect confluent edema or even pneumonia with small bilateral pleural effusions. Recommend followup chest radiograph after treatment to verify improvement.   Electronically Signed   By: Elon Alas   On: 09/16/2013 00:23    Scheduled Meds: . allopurinol  300 mg Oral Daily  . aspirin EC  81 mg Oral Daily  . azithromycin  500 mg Intravenous Q24H  . carvedilol  18.75 mg Oral BID WC  . cefTRIAXone (ROCEPHIN)  IV  1 g Intravenous Q24H  . colchicine  0.6 mg Oral Daily  . FLUoxetine  10 mg Oral Daily  . furosemide  40 mg Intravenous BID  . guaiFENesin  400 mg Oral BID  . hydrALAZINE  25 mg Oral TID  . insulin aspart  0-15 Units Subcutaneous TID WC  . insulin aspart  0-5 Units Subcutaneous QHS  . irbesartan  300 mg Oral Daily  . isosorbide dinitrate  20 mg Oral TID  . pantoprazole  40 mg Oral Daily  . potassium chloride SA  40 mEq Oral BID  . sodium chloride  3 mL Intravenous Q12H   Continuous Infusions:   Active Problems:   HYPERTENSION, UNSPECIFIED   CAD, NATIVE VESSEL   Atrial fibrillation   Ischemic cardiomyopathy   Chronic kidney disease, stage IV (severe)   Hemoptysis   CAP (community acquired pneumonia)   Acute on chronic systolic CHF (congestive heart failure)   Diabetes mellitus  Time spent: 25  This note has been created with Surveyor, quantity. Any transcriptional errors are unintentional.   Marzetta Board, MD [facility] Pager [phone]. If 7 PM - 7 AM, please contact night-coverage at [email or web address], password [email or web address] 09/17/2013, 10:43 AM  LOS: 2 days

## 2013-09-17 NOTE — Evaluation (Signed)
Physical Therapy Evaluation Patient Details Name: Aaron [name] MRN: [id number] DOB: [birth year] Today's Date: 09/17/2013   History of Present Illness  pt is a 73 y/o male admitted for CAP vs mild CHF  Clinical Impression  Pt presents with balance and gait deficits.  Will benefit from skilled PT for balance and dynamic gait challenges to reduce risk of falls for safe d/c home.    Follow Up Recommendations Home health PT;Supervision - Intermittent    Equipment Recommendations  Other (comment) (TBD)    Recommendations for Other Services       Precautions / Restrictions Restrictions Weight Bearing Restrictions: No      Mobility  Bed Mobility Overal bed mobility: Modified Independent             General bed mobility comments: increased time  Transfers Overall transfer level: Needs assistance   Transfers: Sit to/from Stand Sit to Stand: Supervision            Ambulation/Gait Ambulation/Gait assistance: Supervision Ambulation Distance (Feet): 250 Feet Assistive device: None     Gait velocity interpretation: Below normal speed for age/gender General Gait Details: pt with several LOB in controlled environment, pt able to self correct, close supervision required.  Pt states he has not fallen at home but has had some "close calls"  will benefit from dynamic gait training  Stairs Stairs: Yes Stairs assistance: Supervision Stair Management: One rail Right Number of Stairs: 3 General stair comments: pt with good stability using rail  Wheelchair Mobility    Modified Rankin (Stroke Patients Only)       Balance                                     Pertinent Vitals/Pain No c/o pain.  spO2 96% on room air during gait    Home Living Family/patient expects to be discharged to:: Private residence Living Arrangements: Spouse/significant other Available Help at Discharge: Family Type of Home: House Home Access: Stairs to enter Entrance  Stairs-Rails: Right Entrance Stairs-Number of Steps: 4-5 Home Layout: One level Home Equipment: [place] - single point      Prior Function Level of Independence: Independent         Comments: uses cane "when I feel like it"     Hand Dominance        Extremity/Trunk Assessment   Upper Extremity Assessment: Generalized weakness           Lower Extremity Assessment: Generalized weakness      Cervical / Trunk Assessment: Normal  Communication   Communication: No difficulties  Cognition Arousal/Alertness: Awake/alert Behavior During Therapy: WFL for tasks assessed/performed Overall Cognitive Status: Within Functional Limits for tasks assessed                      General Comments      Exercises        Assessment/Plan    PT Assessment Patient needs continued PT services  PT Diagnosis Difficulty walking;Generalized weakness   PT Problem List Decreased activity tolerance;Decreased strength;Decreased mobility;Decreased balance;Decreased knowledge of use of DME  PT Treatment Interventions Gait training;Balance training;DME instruction;Modalities;Neuromuscular re-education;Stair training;Functional mobility training;Patient/family education;Therapeutic activities;Therapeutic exercise   PT Goals (Current goals can be found in the Care Plan section) Acute Rehab PT Goals Patient Stated Goal: get home for Easter PT Goal Formulation: With patient Time For Goal Achievement: 09/24/13 Potential to Achieve Goals: Good  Frequency Min 3X/week   Barriers to discharge        End of Session Equipment Utilized During Treatment: Gait belt Activity Tolerance: Patient tolerated treatment well Patient left: in bed;with call bell/phone within reach         Time: 0725-0740 PT Time Calculation (min): 15 min   Charges:   PT Evaluation $Initial PT Evaluation Tier I: 1 Procedure     PT G Codes:          Aaron [name] 09/17/2013, 8:04 AM

## 2013-09-18 DIAGNOSIS — E119 Type 2 diabetes mellitus without complications: Secondary | ICD-10-CM

## 2013-09-18 DIAGNOSIS — I2510 Atherosclerotic heart disease of native coronary artery without angina pectoris: Secondary | ICD-10-CM

## 2013-09-18 LAB — CBC
HCT: 29.5 % — ABNORMAL LOW (ref 39.0–52.0)
HEMOGLOBIN: 9.1 g/dL — AB (ref 13.0–17.0)
MCH: 23.2 pg — AB (ref 26.0–34.0)
MCHC: 30.8 g/dL (ref 30.0–36.0)
MCV: 75.3 fL — AB (ref 78.0–100.0)
Platelets: 92 10*3/uL — ABNORMAL LOW (ref 150–400)
RBC: 3.92 MIL/uL — ABNORMAL LOW (ref 4.22–5.81)
RDW: 18.3 % — ABNORMAL HIGH (ref 11.5–15.5)
WBC: 9.9 10*3/uL (ref 4.0–10.5)

## 2013-09-18 LAB — BASIC METABOLIC PANEL
BUN: 51 mg/dL — AB (ref 6–23)
CHLORIDE: 106 meq/L (ref 96–112)
CO2: 20 meq/L (ref 19–32)
Calcium: 9.1 mg/dL (ref 8.4–10.5)
Creatinine, Ser: 1.47 mg/dL — ABNORMAL HIGH (ref 0.50–1.35)
GFR calc Af Amer: 54 mL/min — ABNORMAL LOW (ref >90)
GFR calc non Af Amer: 46 mL/min — ABNORMAL LOW (ref >90)
GLUCOSE: 130 mg/dL — AB (ref 70–99)
POTASSIUM: 4.5 meq/L (ref 3.7–5.3)
Sodium: 141 mEq/L (ref 137–147)

## 2013-09-18 LAB — PROCALCITONIN: PROCALCITONIN: 2.62 ng/mL

## 2013-09-18 LAB — GLUCOSE, CAPILLARY
GLUCOSE-CAPILLARY: 135 mg/dL — AB (ref 70–99)
Glucose-Capillary: 124 mg/dL — ABNORMAL HIGH (ref 70–99)
Glucose-Capillary: 151 mg/dL — ABNORMAL HIGH (ref 70–99)
Glucose-Capillary: 155 mg/dL — ABNORMAL HIGH (ref 70–99)

## 2013-09-18 MED ORDER — FLUOXETINE HCL 10 MG PO CAPS
10.0000 mg | ORAL_CAPSULE | Freq: Every day | ORAL | Status: DC
  Administered 2013-09-18 – 2013-09-19 (×2): 10 mg via ORAL
  Filled 2013-09-18 (×2): qty 1

## 2013-09-18 NOTE — Progress Notes (Signed)
Physical Therapy Treatment Patient Details Name: Aaron [name] MRN: [id number] DOB: 18-May-1941 Today's Date: 09/18/2013    History of Present Illness      PT Comments    Continesu to progress with therapy. Encourage to work with staff daily.  Follow Up Recommendations  Home health PT;Supervision - Intermittent     Equipment Recommendations       Recommendations for Other Services       Precautions / Restrictions Precautions Precautions: None    Mobility  Bed Mobility                  Transfers Overall transfer level: Modified independent Equipment used: None Transfers: Sit to/from Stand Sit to Stand: Supervision         General transfer comment: Pateint able to stand up from chair without A  Ambulation/Gait Ambulation/Gait assistance: Min guard Ambulation Distance (Feet): 300 Feet Assistive device: None Gait Pattern/deviations: Step-to pattern;Decreased stride length     General Gait Details: Pateint able to ambulate in crowded hall with no LOB. Patient sightly gaurded with gait.   Stairs            Wheelchair Mobility    Modified Rankin (Stroke Patients Only)       Balance                                    Cognition Arousal/Alertness: Awake/alert Behavior During Therapy: WFL for tasks assessed/performed Overall Cognitive Status: Within Functional Limits for tasks assessed                      Exercises      General Comments        Pertinent Vitals/Pain Denies pain. Patient O2 sat remained 96-98% room air throughout session.    Home Living                      Prior Function            PT Goals (current goals can now be found in the care plan section) Progress towards PT goals: Progressing toward goals    Frequency  Min 3X/week    PT Plan Current plan remains appropriate    Co-evaluation             End of Session Equipment Utilized During Treatment: Gait belt Activity  Tolerance: Patient tolerated treatment well Patient left: in chair;with call bell/phone within reach     Time: 1435-1450 PT Time Calculation (min): 15 min  Charges:                       G Codes:      Tehila Sokolow, SPTA 09/18/2013, 3:06 PM

## 2013-09-18 NOTE — Progress Notes (Signed)
PROGRESS NOTE  Aaron [name] TKZ:[id number] DOB: 1941-02-16 DOA: 09/15/2013 PCP: Nicoletta Dress, MD  Assessment/Plan: Community-acquired pneumonia -Positive urine Streptococcus pneumonia antigen -Continue ceftriaxone/azithromycin -Urine Legionella antigen negative -Procalcitonin 2.62<--4.39 Acute on chronic systolic CHF -neg 4 L since admission -neg 1.5kg since admit -d/c lasix after tonight's dose -daily wts -am BMP -09/16/2013 echocardiogram EF 57-32%, grade 2 diastolic dysfunction CKD stage III -Baseline creatinine 1.5-1.7 -Continue to monitor with diuresis Hypertension  -continue carvedilol, Avapro Diabetes mellitus type 2 -Hemoglobin A1c -Patient was taking saxagliptin prior to admission CAD - continue aspirin, Isordil  Hx PAF -currently in sinus Microcytic anemia  -Check iron studies  thrombocytopenia  -Likely medication related  -No signs of active bleeding  -Continue monitor  -Check serum B12, RBC folate    Family Communication:   son at beside Disposition Plan:   Home when medically stable   Antibiotics:  Ceftriaxone 09/16/2013>>>  Azithromycin 09/16/2013>>>    Procedures/Studies: Dg Chest 2 View  09/16/2013   CLINICAL DATA:  Hemoptysis, fever, history of bronchitis.  EXAM: CHEST  2 VIEW  COMPARISON:  DG CHEST 2V dated 04/04/2012  FINDINGS: The cardiac silhouette appears moderate to severely enlarged, slightly increased from prior examination. Central pulmonary vasculature congestion. Patchy right lower lobe airspace opacity with small bilateral pleural effusions. Mild chronic interstitial changes. Biapical pleural thickening. Trachea projects midline and there is no pneumothorax.  Status post median sternotomy for apparent CABG. Mild degenerative change of the thoracic spine. Soft tissue planes are nonsuspicious.  IMPRESSION: Moderate to severe cardiomegaly with central pulmonary vasculature congestion. Right lower lobe consolidation could  reflect confluent edema or even pneumonia with small bilateral pleural effusions. Recommend followup chest radiograph after treatment to verify improvement.   Electronically Signed   By: Elon Alas   On: 09/16/2013 00:23         Subjective:  patient states that his breathing is 50% better. Continues to have some dyspnea on exertion. Denies any fevers, chills, chest discomfort, nausea, vomiting, diarrhea, abdominal pain, dysuria. No dizziness or syncope.  Objective: Filed Vitals:   09/18/13 1103 09/18/13 1427 09/18/13 1524 09/18/13 1615  BP: 124/73 120/58 147/80 134/56  Pulse: 70 70  73  Temp: 98 F (36.7 C) 98.7 F (37.1 C) 98.3 F (36.8 C)   TempSrc: Oral Oral Oral   Resp: 18 18 17    Height:      Weight:      SpO2: 97% 97% 99%     Intake/Output Summary (Last 24 hours) at 09/18/13 1851 Last data filed at 09/18/13 1046  Gross per 24 hour  Intake    240 ml  Output   1200 ml  Net   -960 ml   Weight change: 0 kg (0 lb) Exam:   General:  Pt is alert, follows commands appropriately, not in acute distress  HEENT: No icterus, No thrush,  [place]/AT  Cardiovascular: RRR, S1/S2, no rubs, no gallops  Respiratory: left clear to auscultation, right basilar crackles. No wheezing.  Abdomen: Soft/+BS, non tender, non distended, no guarding  Extremities: 1+ edema, No lymphangitis, No petechiae, No rashes, no synovitis  Data Reviewed: Basic Metabolic Panel:  Recent Labs Lab 09/15/13 2308 09/16/13 0440 09/17/13 0515 09/18/13 0425  NA 143 141 142 141  K 4.5 4.4 4.4 4.5  CL 103 102 105 106  CO2 22 20 23 20   GLUCOSE 150* 137* 119* 130*  BUN 39* 40* 47* 51*  CREATININE 1.51* 1.49* 1.55* 1.47*  CALCIUM 9.3 9.3 8.7  9.1   Liver Function Tests:  Recent Labs Lab 09/16/13 0440  AST 15  ALT 17  ALKPHOS 98  BILITOT 1.4*  PROT 6.2  ALBUMIN 3.3*   No results found for this basename: LIPASE, AMYLASE,  in the last 168 hours No results found for this basename: AMMONIA,   in the last 168 hours CBC:  Recent Labs Lab 09/15/13 2308 09/16/13 0440 09/17/13 0515 09/18/13 0425  WBC 13.5* 14.0* 10.1 9.9  NEUTROABS  --  13.0*  --   --   HGB 10.4* 10.0* 9.1* 9.1*  HCT 33.2* 32.1* 29.5* 29.5*  MCV 76.1* 76.1* 76.0* 75.3*  PLT 101* 102* 84* 92*   Cardiac Enzymes:  Recent Labs Lab 09/16/13 0440 09/16/13 1132 09/16/13 1707  TROPONINI <0.30 <0.30 <0.30   BNP: No components found with this basename: POCBNP,  CBG:  Recent Labs Lab 09/17/13 1624 09/17/13 2053 09/18/13 0635 09/18/13 1110 09/18/13 1617  GLUCAP 112* 148* 124* 151* 135*    No results found for this or any previous visit (from the past 240 hour(s)).   Scheduled Meds: . allopurinol  300 mg Oral Daily  . aspirin EC  81 mg Oral Daily  . azithromycin  500 mg Oral Q24H  . carvedilol  18.75 mg Oral BID WC  . cefTRIAXone (ROCEPHIN)  IV  1 g Intravenous Q24H  . colchicine  0.6 mg Oral Daily  . FLUoxetine  10 mg Oral Daily  . furosemide  40 mg Intravenous BID  . guaiFENesin  400 mg Oral BID  . hydrALAZINE  25 mg Oral TID  . insulin aspart  0-15 Units Subcutaneous TID WC  . insulin aspart  0-5 Units Subcutaneous QHS  . irbesartan  300 mg Oral Daily  . isosorbide dinitrate  20 mg Oral TID  . pantoprazole  40 mg Oral Daily  . potassium chloride SA  40 mEq Oral BID  . sodium chloride  3 mL Intravenous Q12H   Continuous Infusions:    [name], DO  [facility] Pager [phone]  If 7PM-7AM, please contact night-coverage [email or web address] Password [email or web address] 09/18/2013, 6:51 PM   LOS: 3 days

## 2013-09-19 DIAGNOSIS — I2589 Other forms of chronic ischemic heart disease: Secondary | ICD-10-CM

## 2013-09-19 DIAGNOSIS — I10 Essential (primary) hypertension: Secondary | ICD-10-CM

## 2013-09-19 LAB — BASIC METABOLIC PANEL
BUN: 49 mg/dL — ABNORMAL HIGH (ref 6–23)
CALCIUM: 8.8 mg/dL (ref 8.4–10.5)
CO2: 22 mEq/L (ref 19–32)
Chloride: 107 mEq/L (ref 96–112)
Creatinine, Ser: 1.47 mg/dL — ABNORMAL HIGH (ref 0.50–1.35)
GFR calc Af Amer: 54 mL/min — ABNORMAL LOW (ref >90)
GFR calc non Af Amer: 46 mL/min — ABNORMAL LOW (ref >90)
Glucose, Bld: 166 mg/dL — ABNORMAL HIGH (ref 70–99)
Potassium: 4 mEq/L (ref 3.7–5.3)
Sodium: 145 mEq/L (ref 137–147)

## 2013-09-19 LAB — RETICULOCYTES
RBC.: 4.21 MIL/uL — AB (ref 4.22–5.81)
RETIC CT PCT: 1.7 % (ref 0.4–3.1)
Retic Count, Absolute: 71.6 10*3/uL (ref 19.0–186.0)

## 2013-09-19 LAB — IRON AND TIBC
Iron: 25 ug/dL — ABNORMAL LOW (ref 42–135)
Saturation Ratios: 7 % — ABNORMAL LOW (ref 20–55)
TIBC: 354 ug/dL (ref 215–435)
UIBC: 329 ug/dL (ref 125–400)

## 2013-09-19 LAB — VITAMIN B12: Vitamin B-12: 886 pg/mL (ref 211–911)

## 2013-09-19 LAB — FOLATE RBC: RBC FOLATE: 902 ng/mL — AB (ref >280)

## 2013-09-19 LAB — FERRITIN: Ferritin: 110 ng/mL (ref 22–322)

## 2013-09-19 LAB — GLUCOSE, CAPILLARY
Glucose-Capillary: 144 mg/dL — ABNORMAL HIGH (ref 70–99)
Glucose-Capillary: 134 mg/dL — ABNORMAL HIGH (ref 70–99)

## 2013-09-19 LAB — HEMOGLOBIN A1C
HEMOGLOBIN A1C: 5.9 % — AB (ref <5.7)
MEAN PLASMA GLUCOSE: 123 mg/dL — AB (ref <117)

## 2013-09-19 MED ORDER — FUROSEMIDE 80 MG PO TABS
80.0000 mg | ORAL_TABLET | Freq: Every day | ORAL | Status: DC
  Administered 2013-09-19: 80 mg via ORAL
  Filled 2013-09-19 (×2): qty 1

## 2013-09-19 MED ORDER — CEFPODOXIME PROXETIL 200 MG PO TABS: 200.0000 mg | ORAL_TABLET | Freq: Two times a day (BID) | ORAL | Status: DC

## 2013-09-19 MED ORDER — CEFPODOXIME PROXETIL 200 MG PO TABS
200.0000 mg | ORAL_TABLET | Freq: Two times a day (BID) | ORAL | Status: DC
  Administered 2013-09-19: 200 mg via ORAL
  Filled 2013-09-19 (×2): qty 1

## 2013-09-19 MED ORDER — AZITHROMYCIN 500 MG PO TABS: 500.0000 mg | ORAL_TABLET | ORAL | Status: DC

## 2013-09-19 NOTE — Discharge Summary (Signed)
Physician Discharge Summary  Aaron [name] ZOX:[id number] DOB: [birth year] DOA: 09/15/2013  PCP: Nicoletta Dress, MD  Admit date: 09/15/2013 Discharge date: 09/19/2013  Recommendations for Outpatient Follow-up:  1. Pt will need to follow up with PCP in 2 weeks post discharge 2. Please obtain BMP in one week 3. Please also check CBC to evaluate Hg and Hct levels   Discharge Diagnoses:  Active Problems:   HYPERTENSION, UNSPECIFIED   CAD, NATIVE VESSEL   Atrial fibrillation   Ischemic cardiomyopathy   Chronic kidney disease, stage IV (severe)   Hemoptysis   CAP (community acquired pneumonia)   Acute on chronic systolic CHF (congestive heart failure)   Diabetes mellitus Community-acquired pneumonia  -Positive urine Streptococcus pneumonia antigen  -Continue ceftriaxone/azithromycin while in hospital -Urine Legionella antigen negative  -Procalcitonin 2.62<--4.39  -Patient will go home with cefpodoxime and azithromycin for 4 additional days which complete one week of therapy. Acute on chronic systolic CHF  -neg 4.9 L since admission  -neg 2.8kg since admit  -d/c lasix after tonight's dose  -daily wts  -pt will go home on furosemide 80 mg by mouth daily -09/16/2013 echocardiogram EF 81-19%, grade 2 diastolic dysfunction  -Followup with primary care provider for BMP in one week CKD stage III  -Baseline creatinine 1.5-1.7  -Continue to monitor with diuresis  -Serum creatinine 1.47 on the day of discharge Hypertension  -continue carvedilol, Avapro  Diabetes mellitus type 2  -Hemoglobin A1c--pending on the day of discharge please follow up in the outpatient setting  -Patient was taking saxagliptin prior to admission  CAD  - continue aspirin, Isordil  Hx PAF  -currently in sinus  Microcytic anemia  -Check iron studies-pending on the day of discharge please followup results- thrombocytopenia  -Likely medication related  -No signs of active bleeding  -Continue monitor    -Check serum B12, RBC folate-- pending on day of d/c please f/u results   Antibiotics:  Ceftriaxone 09/16/2013>>>  Azithromycin 09/16/2013>>>   Discharge Condition: Stable  Disposition:  Follow-up Information   Follow up with Kirk Ruths, MD In 1 month.   Specialty:  Cardiology   Contact information:   [address] [place] [address] [phone]     Home  Diet: Heart healthy  Wt Readings from Last 3 Encounters:  09/19/13 70.9 kg (156 lb 4.9 oz)  05/10/13 69.4 kg (153 lb)  03/13/13 71.487 kg (157 lb 9.6 oz)    History of present illness:  73 y.o. male  has a past medical history of Hyperlipidemia; Hypertension; CAD (coronary artery disease); Prostate cancer; Stroke; Injury of sigmoid colon; Renal artery stenosis; Chronic systolic heart failure; Ischemic cardiomyopathy; Carotid stenosis; Diabetes mellitus; Acute myocardial infarction, subendocardial infarction, subsequent episode of care; Depression; Gout; Anxiety; Osteoarthritis; and PVC's (premature ventricular contractions).  Presented with  3 week hx of cough but worse for the past week. On day of d/c he started to cough up a Small amount of blood that now started to turn slightly darker brown color. He had some low grade fever, chills, fatigue, no myalgias, denies any chest pain. He had some shortness of breath and some increase swelling in ankles. He thinks he has gained 4-5 lb in the past few weeks. Have been taking his medications as prescribed. No dietary indiscretions.  No new oxygen requirement.  Of note he has hx of Merkel cell cancer sp radiation therapy at [facility]. Chest x-ray showed right lower lobe consolidation. Procalcitonin on the time of admission was 4.39. The  patient was started on ceftriaxone and azithromycin. The patient clinically improved. His dyspnea improved.  His WBC improved. Procalcitonin decreased to 2.62. Cardiology was consulted.  They did not have any new recommendations and  recommended to f/u with Dr. Stanford Breed as outpt. The patient remained in sinus rhythm throughout the hospitalization. He did not have any runs of tachycardia.      Consultants: Cardiology  Discharge Exam: Filed Vitals:   09/19/13 0603  BP: 150/51  Pulse: 52  Temp: 98.2 F (36.8 C)  Resp: 18   Filed Vitals:   09/18/13 1524 09/18/13 1615 09/18/13 2127 09/19/13 0603  BP: 147/80 134/56 143/65 150/51  Pulse:  73 74 52  Temp: 98.3 F (36.8 C)  99.1 F (37.3 C) 98.2 F (36.8 C)  TempSrc: Oral  Oral Oral  Resp: 17  18 18   Height:      Weight:    70.9 kg (156 lb 4.9 oz)  SpO2: 99%  96% 98%   General: A&O x 3, NAD, pleasant, cooperative Cardiovascular: RRR, no rub, no gallop, no S3 Respiratory: Bibasilar crackles. Left clear to auscultation. No wheezing. Abdomen:soft, nontender, nondistended, positive bowel sounds Extremities: 1+LE edema, No lymphangitis, no petechiae  Discharge Instructions      Discharge Orders   Future Appointments Provider Department Dept Phone   11/08/2013 9:00 AM Lelon Perla, MD [facility] [phone]   Future Orders Complete By Expires   Diet - low sodium heart healthy  As directed    Increase activity slowly  As directed        Medication List         allopurinol 300 MG tablet  Commonly known as:  ZYLOPRIM  Take 300 mg by mouth daily.     aspirin EC 81 MG tablet  Take 81 mg by mouth daily.     azithromycin 500 MG tablet  Commonly known as:  ZITHROMAX  Take 1 tablet (500 mg total) by mouth daily.     carvedilol 12.5 MG tablet  Commonly known as:  COREG  Take 18 mg by mouth 2 (two) times daily with a meal. Take 1.5 tablets     cefpodoxime 200 MG tablet  Commonly known as:  VANTIN  Take 1 tablet (200 mg total) by mouth every 12 (twelve) hours.     clopidogrel 75 MG tablet  Commonly known as:  PLAVIX  Take 75 mg by mouth daily with breakfast.     colchicine 0.6 MG tablet  Take 0.6 mg by mouth daily.      FLUoxetine 10 MG tablet  Commonly known as:  PROZAC  Take 10 mg by mouth daily.     furosemide 40 MG tablet  Commonly known as:  LASIX  Take 80 mg by mouth daily.     guaiFENesin 200 MG tablet  Take 400 mg by mouth 2 (two) times daily.     hydrALAZINE 25 MG tablet  Commonly known as:  APRESOLINE  Take 25 mg by mouth 3 (three) times daily.     isosorbide dinitrate 20 MG tablet  Commonly known as:  ISORDIL  Take 20 mg by mouth 3 (three) times daily.     lansoprazole 30 MG capsule  Commonly known as:  PREVACID  Take 30 mg by mouth daily.     nitroGLYCERIN 0.4 MG SL tablet  Commonly known as:  NITROSTAT  Place 0.4 mg under the tongue every 5 (five) minutes as needed.     potassium chloride  SA 20 MEQ tablet  Commonly known as:  K-DUR,KLOR-CON  Take 40 mEq by mouth 2 (two) times daily.     saxagliptin HCl 2.5 MG Tabs tablet  Commonly known as:  ONGLYZA  Take 2.5 mg by mouth daily.     valsartan 320 MG tablet  Commonly known as:  DIOVAN  Take 320 mg by mouth daily.         The results of significant diagnostics from this hospitalization (including imaging, microbiology, ancillary and laboratory) are listed below for reference.    Significant Diagnostic Studies: Dg Chest 2 View  09/16/2013   CLINICAL DATA:  Hemoptysis, fever, history of bronchitis.  EXAM: CHEST  2 VIEW  COMPARISON:  DG CHEST 2V dated 04/04/2012  FINDINGS: The cardiac silhouette appears moderate to severely enlarged, slightly increased from prior examination. Central pulmonary vasculature congestion. Patchy right lower lobe airspace opacity with small bilateral pleural effusions. Mild chronic interstitial changes. Biapical pleural thickening. Trachea projects midline and there is no pneumothorax.  Status post median sternotomy for apparent CABG. Mild degenerative change of the thoracic spine. Soft tissue planes are nonsuspicious.  IMPRESSION: Moderate to severe cardiomegaly with central pulmonary vasculature  congestion. Right lower lobe consolidation could reflect confluent edema or even pneumonia with small bilateral pleural effusions. Recommend followup chest radiograph after treatment to verify improvement.   Electronically Signed   By: Elon Alas   On: 09/16/2013 00:23     Microbiology: No results found for this or any previous visit (from the past 240 hour(s)).   Labs: Basic Metabolic Panel:  Recent Labs Lab 09/15/13 2308 09/16/13 0440 09/17/13 0515 09/18/13 0425 09/19/13 0430  NA 143 141 142 141 145  K 4.5 4.4 4.4 4.5 4.0  CL 103 102 105 106 107  CO2 22 20 23 20 22   GLUCOSE 150* 137* 119* 130* 166*  BUN 39* 40* 47* 51* 49*  CREATININE 1.51* 1.49* 1.55* 1.47* 1.47*  CALCIUM 9.3 9.3 8.7 9.1 8.8   Liver Function Tests:  Recent Labs Lab 09/16/13 0440  AST 15  ALT 17  ALKPHOS 98  BILITOT 1.4*  PROT 6.2  ALBUMIN 3.3*   No results found for this basename: LIPASE, AMYLASE,  in the last 168 hours No results found for this basename: AMMONIA,  in the last 168 hours CBC:  Recent Labs Lab 09/15/13 2308 09/16/13 0440 09/17/13 0515 09/18/13 0425  WBC 13.5* 14.0* 10.1 9.9  NEUTROABS  --  13.0*  --   --   HGB 10.4* 10.0* 9.1* 9.1*  HCT 33.2* 32.1* 29.5* 29.5*  MCV 76.1* 76.1* 76.0* 75.3*  PLT 101* 102* 84* 92*   Cardiac Enzymes:  Recent Labs Lab 09/16/13 0440 09/16/13 1132 09/16/13 1707  TROPONINI <0.30 <0.30 <0.30   BNP: No components found with this basename: POCBNP,  CBG:  Recent Labs Lab 09/18/13 0635 09/18/13 1110 09/18/13 1617 09/18/13 2123 09/19/13 0610  GLUCAP 124* 151* 135* 155* 144*    Time coordinating discharge:  Greater than 30 minutes  Signed:  Lilu Mcglown, DO [facility] Pager: [phone] 09/19/2013, 9:25 AM

## 2013-09-19 NOTE — Progress Notes (Signed)
Discharge instructions given. Pt verbalized understanding and all questions were answered.  

## 2013-09-19 NOTE — Progress Notes (Signed)
Seen and agreed 09/19/2013 Robinette, Julia Elizabeth PTA [phone] pager [phone] office    

## 2013-09-26 ENCOUNTER — Other Ambulatory Visit: Payer: Self-pay | Admitting: Cardiology

## 2013-09-26 ENCOUNTER — Other Ambulatory Visit: Payer: Self-pay | Admitting: *Deleted

## 2013-09-26 MED ORDER — ISOSORBIDE DINITRATE 20 MG PO TABS: 20.0000 mg | ORAL_TABLET | Freq: Three times a day (TID) | ORAL | Status: DC

## 2013-09-26 MED ORDER — CLOPIDOGREL BISULFATE 75 MG PO TABS: 75.0000 mg | ORAL_TABLET | Freq: Every day | ORAL | Status: DC

## 2013-10-02 ENCOUNTER — Telehealth: Payer: Self-pay | Admitting: Cardiology

## 2013-10-02 MED ORDER — FUROSEMIDE 40 MG PO TABS: 80.0000 mg | ORAL_TABLET | Freq: Every day | ORAL | Status: DC

## 2013-10-02 NOTE — Telephone Encounter (Signed)
Spoke with pt, he was recently in the hosp with pneumonia At that time he had a lot of swelling and with the IV lasix it went away. Within 3-4 days after discharge his legs are now swollen again. He is a little SOB but it is improving. Is cough is improving as well. His weight is stable from discharge. He is currently taking 80 mg of furosemide daily. He watches his salt and props his legs up when able. He is concerned about the swelling and wonders what to do.\ Will forward for dr Stanford Breed review

## 2013-10-02 NOTE — Telephone Encounter (Signed)
Lasix 80 mg BID for 2 days and then 80 mg daily; bmet one week, restrict po fluid intake and low NA diet. Aaron [name]

## 2013-10-02 NOTE — Telephone Encounter (Signed)
Spoke with pt, Aware of dr Jacalyn Lefevre recommendations.  New script sent into the pharm He will have labs drawn with dr Carolanne Grumbling in [place]

## 2013-10-02 NOTE — Telephone Encounter (Signed)
New problem    Pt needs a call back about swelling in booth legs, this has been going on for about a wk now.   Pt has questions about what he should do maybe medication wise?

## 2013-10-11 ENCOUNTER — Ambulatory Visit (INDEPENDENT_AMBULATORY_CARE_PROVIDER_SITE_OTHER): Payer: Medicare Other | Admitting: Cardiology

## 2013-10-11 ENCOUNTER — Encounter: Payer: Self-pay | Admitting: Cardiology

## 2013-10-11 ENCOUNTER — Ambulatory Visit (INDEPENDENT_AMBULATORY_CARE_PROVIDER_SITE_OTHER)
Admission: RE | Admit: 2013-10-11 | Discharge: 2013-10-11 | Disposition: A | Discharge status: Home / Self Care | Payer: Medicare Other | Source: Ambulatory Visit | Attending: Cardiology | Admitting: Cardiology

## 2013-10-11 VITALS — BP 120/68 | HR 56 | Ht 66.0 in | Wt 149.0 lb

## 2013-10-11 DIAGNOSIS — I2589 Other forms of chronic ischemic heart disease: Secondary | ICD-10-CM

## 2013-10-11 DIAGNOSIS — I5022 Chronic systolic (congestive) heart failure: Secondary | ICD-10-CM

## 2013-10-11 DIAGNOSIS — R0609 Other forms of dyspnea: Secondary | ICD-10-CM

## 2013-10-11 DIAGNOSIS — I255 Ischemic cardiomyopathy: Secondary | ICD-10-CM

## 2013-10-11 DIAGNOSIS — R0989 Other specified symptoms and signs involving the circulatory and respiratory systems: Secondary | ICD-10-CM

## 2013-10-11 DIAGNOSIS — I10 Essential (primary) hypertension: Secondary | ICD-10-CM

## 2013-10-11 DIAGNOSIS — I2510 Atherosclerotic heart disease of native coronary artery without angina pectoris: Secondary | ICD-10-CM

## 2013-10-11 DIAGNOSIS — E785 Hyperlipidemia, unspecified: Secondary | ICD-10-CM

## 2013-10-11 DIAGNOSIS — I4891 Unspecified atrial fibrillation: Secondary | ICD-10-CM

## 2013-10-11 DIAGNOSIS — J189 Pneumonia, unspecified organism: Secondary | ICD-10-CM

## 2013-10-11 DIAGNOSIS — I679 Cerebrovascular disease, unspecified: Secondary | ICD-10-CM

## 2013-10-11 LAB — CBC WITH DIFFERENTIAL/PLATELET
Basophils Absolute: 0 10*3/uL (ref 0.0–0.1)
Basophils Relative: 0.2 % (ref 0.0–3.0)
Eosinophils Absolute: 0.1 10*3/uL (ref 0.0–0.7)
Eosinophils Relative: 1.1 % (ref 0.0–5.0)
HCT: 35.3 % — ABNORMAL LOW (ref 39.0–52.0)
Hemoglobin: 10.9 g/dL — ABNORMAL LOW (ref 13.0–17.0)
Lymphocytes Relative: 5.7 % — ABNORMAL LOW (ref 12.0–46.0)
Lymphs Abs: 0.5 10*3/uL — ABNORMAL LOW (ref 0.7–4.0)
MCHC: 30.8 g/dL (ref 30.0–36.0)
MCV: 76 fl — AB (ref 78.0–100.0)
MONO ABS: 0.5 10*3/uL (ref 0.1–1.0)
Monocytes Relative: 5.4 % (ref 3.0–12.0)
Neutro Abs: 8.2 10*3/uL — ABNORMAL HIGH (ref 1.4–7.7)
Neutrophils Relative %: 87.6 % — ABNORMAL HIGH (ref 43.0–77.0)
PLATELETS: 179 10*3/uL (ref 150.0–400.0)
RBC: 4.64 Mil/uL (ref 4.22–5.81)
RDW: 22.1 % — ABNORMAL HIGH (ref 11.5–14.6)
WBC: 9.3 10*3/uL (ref 4.5–10.5)

## 2013-10-11 LAB — BASIC METABOLIC PANEL
BUN: 39 mg/dL — ABNORMAL HIGH (ref 6–23)
CO2: 26 meq/L (ref 19–32)
Calcium: 9.2 mg/dL (ref 8.4–10.5)
Chloride: 106 mEq/L (ref 96–112)
Creatinine, Ser: 1.5 mg/dL (ref 0.4–1.5)
GFR: 49.71 mL/min — ABNORMAL LOW (ref >60.00)
GLUCOSE: 120 mg/dL — AB (ref 70–99)
Potassium: 4.3 mEq/L (ref 3.5–5.1)
SODIUM: 141 meq/L (ref 135–145)

## 2013-10-11 LAB — BRAIN NATRIURETIC PEPTIDE: Pro B Natriuretic peptide (BNP): 3058 pg/mL — ABNORMAL HIGH (ref 0.0–100.0)

## 2013-10-11 MED ORDER — FUROSEMIDE 40 MG PO TABS: ORAL_TABLET | ORAL | Status: AC

## 2013-10-11 NOTE — Assessment & Plan Note (Signed)
Continue present BP meds

## 2013-10-11 NOTE — Assessment & Plan Note (Signed)
Symptoms worse; change lasix to 80 mg in AM and 40 in PM; check BMET and BNP today and BMET one week.

## 2013-10-11 NOTE — Assessment & Plan Note (Signed)
Continue coreg and ARB; once he recovers from pneumonia, will consider referral to EP for ICD; Merkle cell no recurrence.

## 2013-10-11 NOTE — Assessment & Plan Note (Signed)
Continue ASA

## 2013-10-11 NOTE — Assessment & Plan Note (Signed)
Recent pneumonia; recheck CBC and chest xray; continues with weakness.

## 2013-10-11 NOTE — Assessment & Plan Note (Signed)
Repeat carotid dopplers May 2015.

## 2013-10-11 NOTE — Progress Notes (Signed)
HPI: FU coronary artery disease. Patient underwent CABG 11/11 with a LIMA to the LAD, SVG to the OM, SVG to PDA. This was complicated by perforated viscus from diverticulitis and he underwent colectomy with colostomy that was subsequently taken down. Followup cardiac cath in 03/2011 showed patent grafts and severe LV dysfunction EF 30%. Previously placed stent in left renal artery was patent. He also has history of PVC's, renal insufficiency status post stenting for renal artery stenosis, prior stroke, hypertension, and merkel cell cancer of the scalp treated at [facility] with surgery,radiation. Carotid Dopplers in May 2014 showed 40-59% bilateral ICA stenosis. Followup in 1 year. Echocardiogram in March 2015 showed an ejection fraction of 25-30%, moderate LVH grade 2 diastolic dysfunction. Severe biatrial enlargement. Mild mitral and aortic insufficiency; moderate TR. Recently DCed following admission for pneumonia. Since then, he has some DOE and orthopnea; increased pedal edema  Current Outpatient Prescriptions  Medication Sig Dispense Refill  . allopurinol (ZYLOPRIM) 300 MG tablet Take 300 mg by mouth daily.      Marland Kitchen aspirin EC 81 MG tablet Take 81 mg by mouth daily.        . carvedilol (COREG) 12.5 MG tablet Take 18 mg by mouth 2 (two) times daily with a meal. Take 1.5 tablets      . cefpodoxime (VANTIN) 200 MG tablet Take 1 tablet (200 mg total) by mouth every 12 (twelve) hours.  8 tablet  0  . clopidogrel (PLAVIX) 75 MG tablet Take 1 tablet (75 mg total) by mouth daily with breakfast.  30 tablet  3  . colchicine 0.6 MG tablet Take 0.6 mg by mouth daily.      Marland Kitchen FLUoxetine (PROZAC) 10 MG tablet Take 10 mg by mouth daily.      . furosemide (LASIX) 40 MG tablet Take 2 tablets (80 mg total) by mouth daily.  64 tablet  12  . guaiFENesin 200 MG tablet Take 400 mg by mouth 2 (two) times daily.      . hydrALAZINE (APRESOLINE) 25 MG tablet Take 25 mg by mouth 3 (three) times daily.      .  isosorbide dinitrate (ISORDIL) 20 MG tablet Take 1 tablet (20 mg total) by mouth 3 (three) times daily.  90 tablet  3  . lansoprazole (PREVACID) 30 MG capsule Take 30 mg by mouth daily.       . nitroGLYCERIN (NITROSTAT) 0.4 MG SL tablet Place 0.4 mg under the tongue every 5 (five) minutes as needed.       . potassium chloride SA (K-DUR,KLOR-CON) 20 MEQ tablet Take 40 mEq by mouth 2 (two) times daily.      . saxagliptin HCl (ONGLYZA) 2.5 MG TABS tablet Take 2.5 mg by mouth daily.      . valsartan (DIOVAN) 320 MG tablet Take 320 mg by mouth daily.        No current facility-administered medications for this visit.     Past Medical History  Diagnosis Date  . Hyperlipidemia   . Hypertension   . CAD (coronary artery disease)     NSTEMI 11/11 -  cath: EF 40%, pLAD 30%, 90%, 30-40%, CFX stent occluded, pRCA 30%, dRCA stent ok, pPDA 40%, 30%;  s/p CABG 11/11;LHC 04/01/11: EF 30%, left RA with 30% ISR, right RA patent, RCA stent patent, severe disease in LAD and CFX, L-LAD, S-PDA, S-OM ok    . Prostate cancer     2006, s/p radical prostatectomy at [facility]  .  Stroke     2007  . Injury of sigmoid colon     perforation requiring hemicolectomy and colostomy with subsequent takedown  . Renal artery stenosis     s/p prior ptca  . Chronic systolic heart failure     echo 9/12: Mild LVH, EF 25%, restrictive diast dysfxn with elevated filling pressures, mild AI, mild enlarged Ao root 36 mm, mild MR, mod LAE, mild RVE, mod reduced RVSF, mild to mod PR, PASP 62-66 (mod pulmonary HTN), mod pleural effusion;  echo 05/2011: Mod LVH, EF 40-45%, mild AI, mild LAE, PASP 36  . Ischemic cardiomyopathy   . Carotid stenosis     bilat 40-59% in 8/11  . Diabetes mellitus   . Acute myocardial infarction, subendocardial infarction, subsequent episode of care   . Depression   . Gout   . Anxiety   . Osteoarthritis   . PVC's (premature ventricular contractions)     RBB R superior axis PVCs frequently in  bigeminy    Past Surgical History  Procedure Laterality Date  . Colon surgery    . Prostate surgery    . Coronary artery bypass graft      History   Social History  . Marital Status: Married    Spouse Name: N/A    Number of Children: N/A  . Years of Education: N/A   Occupational History  . Not on file.   Social History Main Topics  . Smoking status: Former Smoker -- 2.00 packs/day for 20 years    Types: Cigarettes    Quit date: 02/08/1977  . Smokeless tobacco: Never Used  . Alcohol Use: No  . Drug Use: No  . Sexual Activity: Not on file   Other Topics Concern  . Not on file   Social History Narrative   Pt lives in [place] with spouse.  Doristine Bosworth of [place], [place]    ROS: no fevers or chills, productive cough, hemoptysis, dysphasia, odynophagia, melena, hematochezia, dysuria, hematuria, rash, seizure activity, orthopnea, PND,  claudication. Remaining systems are negative.  Physical Exam: Well-developed chronically ill appearing in no acute distress.  Skin is warm and dry.  HEENT is normal.  Neck is supple.  Chest with diminished BS RLL Cardiovascular exam is regular rate and rhythm. 2/6 diastolic murmur Abdominal exam nontender or distended. No masses palpated. Extremities show 1+ ankle edema. neuro grossly intact  ECG September 15 2013-sinus, IRBBB, lateral TWI

## 2013-10-11 NOTE — Assessment & Plan Note (Signed)
Intolerant to statins. 

## 2013-10-11 NOTE — Assessment & Plan Note (Signed)
Continue ASA; intolerant to statins.  

## 2013-10-11 NOTE — Patient Instructions (Addendum)
Your physician wants you to follow-up in: 3 Wallace will receive a reminder letter in the mail two months in advance. If you don't receive a letter, please call our office to schedule the follow-up appointment.   A chest x-ray takes a picture of the organs and structures inside the chest, including the heart, lungs, and blood vessels. This test can show several things, including, whether the heart is enlarges; whether fluid is building up in the lungs; and whether pacemaker / defibrillator leads are still in place. AT [address]=[facility]=GO TO THE BASEMENT  CHANGE FUROSEMIDE TO 80 MG IN THE MORNING AND 40 MG IN THE AFTERNOON  Your physician recommends that you return for lab work in: [facility] has requested that you have a carotid duplex. This test is an ultrasound of the carotid arteries in your neck. It looks at blood flow through these arteries that supply the brain with blood. Allow one hour for this exam. There are no restrictions or special instructions.DUE IN MAY

## 2013-10-14 ENCOUNTER — Encounter: Payer: Self-pay | Admitting: Cardiology

## 2013-10-16 ENCOUNTER — Encounter: Payer: Self-pay | Admitting: Cardiology

## 2013-10-16 NOTE — Telephone Encounter (Signed)
New problem   Pt's blood work and xray results that was suppose to be fax to Dr Dorothea Glassman in [place] hasn't been received. Please call pt.

## 2013-10-16 NOTE — Telephone Encounter (Signed)
This encounter was created in error - please disregard.

## 2013-10-18 ENCOUNTER — Ambulatory Visit (HOSPITAL_COMMUNITY): Payer: Medicare Other | Attending: Internal Medicine | Admitting: Cardiology

## 2013-10-18 DIAGNOSIS — Z8673 Personal history of transient ischemic attack (TIA), and cerebral infarction without residual deficits: Secondary | ICD-10-CM | POA: Insufficient documentation

## 2013-10-18 DIAGNOSIS — I6529 Occlusion and stenosis of unspecified carotid artery: Secondary | ICD-10-CM | POA: Insufficient documentation

## 2013-10-18 DIAGNOSIS — I679 Cerebrovascular disease, unspecified: Secondary | ICD-10-CM

## 2013-10-18 DIAGNOSIS — Z87891 Personal history of nicotine dependence: Secondary | ICD-10-CM | POA: Insufficient documentation

## 2013-10-18 DIAGNOSIS — I2510 Atherosclerotic heart disease of native coronary artery without angina pectoris: Secondary | ICD-10-CM | POA: Insufficient documentation

## 2013-10-18 DIAGNOSIS — I10 Essential (primary) hypertension: Secondary | ICD-10-CM | POA: Insufficient documentation

## 2013-10-18 DIAGNOSIS — I658 Occlusion and stenosis of other precerebral arteries: Secondary | ICD-10-CM | POA: Insufficient documentation

## 2013-10-18 NOTE — Progress Notes (Signed)
Carotid duplex completed 

## 2013-11-08 ENCOUNTER — Ambulatory Visit: Payer: Medicare Other | Admitting: Cardiology

## 2013-12-27 ENCOUNTER — Other Ambulatory Visit: Payer: Self-pay | Admitting: Cardiology

## 2014-01-27 ENCOUNTER — Other Ambulatory Visit: Payer: Self-pay | Admitting: Cardiology

## 2014-01-28 ENCOUNTER — Other Ambulatory Visit: Payer: Self-pay

## 2014-01-30 ENCOUNTER — Other Ambulatory Visit: Payer: Self-pay | Admitting: Cardiology

## 2014-02-12 ENCOUNTER — Ambulatory Visit (INDEPENDENT_AMBULATORY_CARE_PROVIDER_SITE_OTHER): Payer: Medicare Other | Admitting: Cardiology

## 2014-02-12 ENCOUNTER — Encounter: Payer: Self-pay | Admitting: Cardiology

## 2014-02-12 VITALS — BP 150/80 | HR 56 | Wt 157.0 lb

## 2014-02-12 DIAGNOSIS — I2589 Other forms of chronic ischemic heart disease: Secondary | ICD-10-CM

## 2014-02-12 DIAGNOSIS — I6529 Occlusion and stenosis of unspecified carotid artery: Secondary | ICD-10-CM

## 2014-02-12 DIAGNOSIS — I5022 Chronic systolic (congestive) heart failure: Secondary | ICD-10-CM

## 2014-02-12 DIAGNOSIS — I4891 Unspecified atrial fibrillation: Secondary | ICD-10-CM

## 2014-02-12 DIAGNOSIS — I255 Ischemic cardiomyopathy: Secondary | ICD-10-CM

## 2014-02-12 DIAGNOSIS — I10 Essential (primary) hypertension: Secondary | ICD-10-CM

## 2014-02-12 DIAGNOSIS — I2510 Atherosclerotic heart disease of native coronary artery without angina pectoris: Secondary | ICD-10-CM

## 2014-02-12 DIAGNOSIS — I701 Atherosclerosis of renal artery: Secondary | ICD-10-CM

## 2014-02-12 DIAGNOSIS — E785 Hyperlipidemia, unspecified: Secondary | ICD-10-CM

## 2014-02-12 LAB — BASIC METABOLIC PANEL WITH GFR
BUN: 28 mg/dL — ABNORMAL HIGH (ref 6–23)
CALCIUM: 9.2 mg/dL (ref 8.4–10.5)
CO2: 27 meq/L (ref 19–32)
Chloride: 103 mEq/L (ref 96–112)
Creat: 1.23 mg/dL (ref 0.50–1.35)
GFR, EST AFRICAN AMERICAN: 68 mL/min
GFR, Est Non African American: 59 mL/min — ABNORMAL LOW
Glucose, Bld: 120 mg/dL — ABNORMAL HIGH (ref 70–99)
POTASSIUM: 4.2 meq/L (ref 3.5–5.3)
SODIUM: 141 meq/L (ref 135–145)

## 2014-02-12 NOTE — Assessment & Plan Note (Signed)
Continue aspirin 

## 2014-02-12 NOTE — Assessment & Plan Note (Signed)
Continue ARB, beta blocker and hydralazine/nitrate. We discussed ICD daily. He declined. He understands the risk of sudden death.

## 2014-02-12 NOTE — Progress Notes (Signed)
HPI: FU coronary artery disease. Patient underwent CABG 11/11 with a LIMA to the LAD, SVG to the OM, SVG to PDA. This was complicated by perforated viscus from diverticulitis and he underwent colectomy with colostomy that was subsequently taken down. Followup cardiac cath in 03/2011 showed patent grafts and severe LV dysfunction EF 30%. Previously placed stent in left renal artery was patent. He also has history of PVC's, renal insufficiency status post stenting for renal artery stenosis, prior stroke, hypertension, and merkel cell cancer of the scalp treated at [facility] with surgery,radiation. Carotid Dopplers in May 2015 showed 40-59% left and 1-39% right ICA stenosis. Followup in 1 year. Echocardiogram in March 2015 showed an ejection fraction of 25-30%, moderate LVH grade 2 diastolic dysfunction. Severe biatrial enlargement. Mild mitral and aortic insufficiency; moderate TR. Since last seen, He has some dyspnea on exertion but improved. No chest pain. No syncope. Pedal edema controlled with diuretics.   Current Outpatient Prescriptions  Medication Sig Dispense Refill  . allopurinol (ZYLOPRIM) 300 MG tablet Take 300 mg by mouth daily.      Marland Kitchen aspirin EC 81 MG tablet Take 81 mg by mouth daily.        . carvedilol (COREG) 12.5 MG tablet Take 18 mg by mouth 2 (two) times daily with a meal. Take 1.5 tablets      . clopidogrel (PLAVIX) 75 MG tablet TAKE ONE TABLET BY MOUTH ONCE DAILY WITH BREAKFAST  30 tablet  1  . colchicine 0.6 MG tablet Take 0.6 mg by mouth daily.      . ferrous sulfate ([employer] IRON) 325 (65 FE) MG tablet Take 325 mg by mouth daily with breakfast.      . FLUoxetine (PROZAC) 10 MG tablet Take 10 mg by mouth daily.      . furosemide (LASIX) 40 MG tablet TAKE TWO TABLETS IN THE AM AND ONE TABLET IN THE PM  90 tablet  12  . guaiFENesin 200 MG tablet Take 400 mg by mouth 2 (two) times daily.      . hydrALAZINE (APRESOLINE) 25 MG tablet Take 25 mg by mouth 3 (three) times daily.       . isosorbide dinitrate (ISORDIL) 20 MG tablet TAKE ONE TABLET BY MOUTH THREE TIMES DAILY  90 tablet  6  . lansoprazole (PREVACID) 30 MG capsule Take 30 mg by mouth daily.       . nitroGLYCERIN (NITROSTAT) 0.4 MG SL tablet Place 0.4 mg under the tongue every 5 (five) minutes as needed.       . Potassium Chloride ER 20 MEQ TBCR       . potassium chloride SA (K-DUR,KLOR-CON) 20 MEQ tablet Take 40 mEq by mouth 2 (two) times daily.      . saxagliptin HCl (ONGLYZA) 2.5 MG TABS tablet Take 2.5 mg by mouth daily.      . valsartan (DIOVAN) 320 MG tablet Take 320 mg by mouth daily.        No current facility-administered medications for this visit.     Past Medical History  Diagnosis Date  . Hyperlipidemia   . Hypertension   . CAD (coronary artery disease)     NSTEMI 11/11 -  cath: EF 40%, pLAD 30%, 90%, 30-40%, CFX stent occluded, pRCA 30%, dRCA stent ok, pPDA 40%, 30%;  s/p CABG 11/11;LHC 04/01/11: EF 30%, left RA with 30% ISR, right RA patent, RCA stent patent, severe disease in LAD and CFX, L-LAD, S-PDA, S-OM ok    .  Prostate cancer     2006, s/p radical prostatectomy at [facility]  . Stroke     2007  . Injury of sigmoid colon     perforation requiring hemicolectomy and colostomy with subsequent takedown  . Renal artery stenosis     s/p prior ptca  . Chronic systolic heart failure     echo 9/12: Mild LVH, EF 25%, restrictive diast dysfxn with elevated filling pressures, mild AI, mild enlarged Ao root 36 mm, mild MR, mod LAE, mild RVE, mod reduced RVSF, mild to mod PR, PASP 62-66 (mod pulmonary HTN), mod pleural effusion;  echo 05/2011: Mod LVH, EF 40-45%, mild AI, mild LAE, PASP 36  . Ischemic cardiomyopathy   . Carotid stenosis     bilat 40-59% in 8/11  . Diabetes mellitus   . Acute myocardial infarction, subendocardial infarction, subsequent episode of care   . Depression   . Gout   . Anxiety   . Osteoarthritis   . PVC's (premature ventricular contractions)     RBB R superior  axis PVCs frequently in bigeminy    Past Surgical History  Procedure Laterality Date  . Colon surgery    . Prostate surgery    . Coronary artery bypass graft      History   Social History  . Marital Status: Married    Spouse Name: N/A    Number of Children: N/A  . Years of Education: N/A   Occupational History  . Not on file.   Social History Main Topics  . Smoking status: Former Smoker -- 2.00 packs/day for 20 years    Types: Cigarettes    Quit date: 02/08/1977  . Smokeless tobacco: Never Used  . Alcohol Use: No  . Drug Use: No  . Sexual Activity: Not on file   Other Topics Concern  . Not on file   Social History Narrative   Pt lives in [place] with spouse.  Doristine Bosworth of [place], [place]    ROS: no fevers or chills, productive cough, hemoptysis, dysphasia, odynophagia, melena, hematochezia, dysuria, hematuria, rash, seizure activity, orthopnea, PND, pedal edema, claudication. Remaining systems are negative.  Physical Exam: Well-developed chronically ill appearing in no acute distress.  Skin is warm and dry.  HEENT is normal.  Neck is supple.  Chest is clear to auscultation with normal expansion. Previous sternotomy Cardiovascular exam is regular rate and rhythm. 2/6 systolic murmur Abdominal exam nontender or distended. No masses palpated. Extremities show trace edema. neuro grossly intact  ECG Sinus rhythm with occasional PAC and PVC. Incomplete right bundle branch block. Nonspecific T-wave changes. First degree AV block. Prolonged QT

## 2014-02-12 NOTE — Assessment & Plan Note (Signed)
Blood pressure mildly elevated but typically controlled at home. Continue present medications. 

## 2014-02-12 NOTE — Patient Instructions (Signed)
Your physician wants you to follow-up in: 6 MONTHS WITH DR CRENSHAW You will receive a reminder letter in the mail two months in advance. If you don't receive a letter, please call our office to schedule the follow-up appointment.   Your physician recommends that you HAVE LAB WORK TODAY 

## 2014-02-12 NOTE — Assessment & Plan Note (Signed)
Intolerant to statins. Continue diet. 

## 2014-02-12 NOTE — Assessment & Plan Note (Signed)
Continue present dose of Lasix. Check potassium and renal function. 

## 2014-02-12 NOTE — Assessment & Plan Note (Signed)
Followup carotid Dopplers May 2016.

## 2014-02-12 NOTE — Assessment & Plan Note (Signed)
Continue aspirin. Intolerant to statins. 

## 2014-02-28 ENCOUNTER — Other Ambulatory Visit: Payer: Self-pay | Admitting: *Deleted

## 2014-02-28 MED ORDER — CARVEDILOL 12.5 MG PO TABS: 18.0000 mg | ORAL_TABLET | Freq: Two times a day (BID) | ORAL | Status: DC

## 2014-03-31 ENCOUNTER — Other Ambulatory Visit: Payer: Self-pay

## 2014-03-31 MED ORDER — CARVEDILOL 12.5 MG PO TABS: 18.0000 mg | ORAL_TABLET | Freq: Two times a day (BID) | ORAL | Status: AC

## 2014-03-31 MED ORDER — CLOPIDOGREL BISULFATE 75 MG PO TABS: ORAL_TABLET | ORAL | Status: AC

## 2014-09-19 DEATH — deceased
